# Patient Record
Sex: Female | Born: 1985 | Race: White | Hispanic: No | Marital: Single | Smoking: Never smoker
Health system: Southern US, Community
[De-identification: ages and names within clinical notes are randomized; demographics above are authoritative.]

## PROBLEM LIST (undated history)

## (undated) DIAGNOSIS — F419 Anxiety disorder, unspecified: Secondary | ICD-10-CM

## (undated) DIAGNOSIS — F489 Nonpsychotic mental disorder, unspecified: Secondary | ICD-10-CM

## (undated) DIAGNOSIS — E039 Hypothyroidism, unspecified: Secondary | ICD-10-CM

## (undated) DIAGNOSIS — Z7289 Other problems related to lifestyle: Secondary | ICD-10-CM

## (undated) HISTORY — PX: FRACTURE SURGERY: SHX138

## (undated) HISTORY — DX: Other problems related to lifestyle: Z72.89

## (undated) HISTORY — DX: Hypothyroidism, unspecified: E03.9

## (undated) HISTORY — DX: Anxiety disorder, unspecified: F41.9

## (undated) HISTORY — DX: Nonpsychotic mental disorder, unspecified: F48.9

---
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
## 2007-03-08 ENCOUNTER — Ambulatory Visit: Payer: Self-pay | Admitting: Internal Medicine

## 2007-03-08 DIAGNOSIS — F489 Nonpsychotic mental disorder, unspecified: Secondary | ICD-10-CM | POA: Insufficient documentation

## 2007-03-08 DIAGNOSIS — L708 Other acne: Secondary | ICD-10-CM | POA: Insufficient documentation

## 2007-03-08 HISTORY — DX: Nonpsychotic mental disorder, unspecified: F48.9

## 2007-04-19 ENCOUNTER — Telehealth: Payer: Self-pay | Admitting: *Deleted

## 2007-09-07 ENCOUNTER — Ambulatory Visit: Payer: Self-pay | Admitting: Internal Medicine

## 2007-09-07 DIAGNOSIS — G4720 Circadian rhythm sleep disorder, unspecified type: Secondary | ICD-10-CM | POA: Insufficient documentation

## 2008-07-02 ENCOUNTER — Ambulatory Visit: Payer: Self-pay | Admitting: Internal Medicine

## 2008-07-02 ENCOUNTER — Telehealth: Payer: Self-pay | Admitting: *Deleted

## 2008-07-02 DIAGNOSIS — F410 Panic disorder [episodic paroxysmal anxiety] without agoraphobia: Secondary | ICD-10-CM

## 2008-07-02 DIAGNOSIS — T50995A Adverse effect of other drugs, medicaments and biological substances, initial encounter: Secondary | ICD-10-CM | POA: Insufficient documentation

## 2009-04-23 ENCOUNTER — Emergency Department (HOSPITAL_COMMUNITY): Admission: EM | Admit: 2009-04-23 | Discharge: 2009-04-24 | Payer: Self-pay | Admitting: Emergency Medicine

## 2009-04-23 ENCOUNTER — Telehealth: Payer: Self-pay | Admitting: Family Medicine

## 2009-04-23 ENCOUNTER — Ambulatory Visit: Payer: Self-pay | Admitting: Internal Medicine

## 2009-04-23 DIAGNOSIS — R110 Nausea: Secondary | ICD-10-CM | POA: Insufficient documentation

## 2009-04-23 LAB — CONVERTED CEMR LAB
Nitrite: NEGATIVE
Specific Gravity, Urine: >=1.03
Urobilinogen, UA: 0.2

## 2009-04-24 ENCOUNTER — Telehealth: Payer: Self-pay | Admitting: *Deleted

## 2009-04-24 ENCOUNTER — Telehealth (INDEPENDENT_AMBULATORY_CARE_PROVIDER_SITE_OTHER): Payer: Self-pay | Admitting: *Deleted

## 2009-04-24 LAB — CONVERTED CEMR LAB
BUN: 8 mg/dL (ref 6–23)
Basophils Absolute: 0 10*3/uL (ref 0.0–0.1)
Bilirubin, Direct: 0.1 mg/dL (ref 0.0–0.3)
CO2: 27 meq/L (ref 19–32)
Chloride: 106 meq/L (ref 96–112)
Creatinine, Ser: 0.7 mg/dL (ref 0.4–1.2)
Eosinophils Absolute: 0 10*3/uL (ref 0.0–0.7)
Hemoglobin: 14 g/dL (ref 12.0–15.0)
Lymphs Abs: 2 10*3/uL (ref 0.7–4.0)
Neutro Abs: 5.7 10*3/uL (ref 1.4–7.7)
Neutrophils Relative %: 69.1 % (ref 43.0–77.0)
Platelets: 302 10*3/uL (ref 150.0–400.0)
Potassium: 3.4 meq/L — ABNORMAL LOW (ref 3.5–5.1)
RBC: 4.61 M/uL (ref 3.87–5.11)
Sodium: 143 meq/L (ref 135–145)
TSH: 16.43 microintl units/mL — ABNORMAL HIGH (ref 0.35–5.50)
Total Bilirubin: 0.8 mg/dL (ref 0.3–1.2)
Total Protein: 8.6 g/dL — ABNORMAL HIGH (ref 6.0–8.3)
WBC: 8.3 10*3/uL (ref 4.5–10.5)

## 2009-04-25 LAB — CONVERTED CEMR LAB
Free T4: 0.8 ng/dL (ref 0.6–1.6)
T3, Free: 3 pg/mL (ref 2.3–4.2)

## 2009-05-09 ENCOUNTER — Ambulatory Visit: Payer: Self-pay | Admitting: Endocrinology

## 2009-05-09 DIAGNOSIS — E039 Hypothyroidism, unspecified: Secondary | ICD-10-CM

## 2009-06-24 ENCOUNTER — Telehealth: Payer: Self-pay | Admitting: *Deleted

## 2009-07-16 ENCOUNTER — Ambulatory Visit: Payer: Self-pay | Admitting: Internal Medicine

## 2009-07-16 DIAGNOSIS — Z91199 Patient's noncompliance with other medical treatment and regimen due to unspecified reason: Secondary | ICD-10-CM | POA: Insufficient documentation

## 2009-07-16 DIAGNOSIS — Z9119 Patient's noncompliance with other medical treatment and regimen: Secondary | ICD-10-CM

## 2009-07-29 ENCOUNTER — Telehealth: Payer: Self-pay | Admitting: Endocrinology

## 2009-08-16 ENCOUNTER — Ambulatory Visit: Payer: Self-pay | Admitting: Family Medicine

## 2009-08-16 DIAGNOSIS — K219 Gastro-esophageal reflux disease without esophagitis: Secondary | ICD-10-CM | POA: Insufficient documentation

## 2009-08-16 DIAGNOSIS — K589 Irritable bowel syndrome without diarrhea: Secondary | ICD-10-CM

## 2009-08-26 ENCOUNTER — Ambulatory Visit: Payer: Self-pay | Admitting: Internal Medicine

## 2009-10-14 ENCOUNTER — Ambulatory Visit: Payer: Self-pay | Admitting: Internal Medicine

## 2009-10-28 ENCOUNTER — Ambulatory Visit: Payer: Self-pay | Admitting: Internal Medicine

## 2009-10-28 ENCOUNTER — Telehealth: Payer: Self-pay | Admitting: Internal Medicine

## 2009-10-28 DIAGNOSIS — J042 Acute laryngotracheitis: Secondary | ICD-10-CM | POA: Insufficient documentation

## 2010-02-04 NOTE — Progress Notes (Signed)
Summary: abnormal labs at ER  Phone Note Call from Patient   Caller: Mom  Eunice Blase) Call For: Taylor Headings MD Summary of Call: Pt's mom called to let Dr. Fabian Sharp know she took [name] to the ER last night, and was told that Pt's thyroid levels were abnormal, and needed to be addressed.  Guadalupe is having multiple anxiety attacks.  [phone] Initial call taken by: Lynann Beaver CMA,  April 24, 2009 10:37 AM  Follow-up for Phone Call        See lab results. Pt's mom aware of lab results.  Follow-up by: Romualdo Bolk, CMA (AAMA),  April 24, 2009 10:44 AM

## 2010-02-04 NOTE — Progress Notes (Signed)
  Phone Note From Other Clinic   Caller: [facility] Call For: panosh Summary of Call: ER called to get lab results from office visit today---pt in ER ---  they will do abd xray--secondary to pt hx constipation and elevated TSH---- phenergan was not working so they will give her zofran.   As long as xrays are normal pt will be sent home Initial call taken by: Loreen Freud DO,  April 23, 2009 9:16 PM

## 2010-02-04 NOTE — Assessment & Plan Note (Signed)
Summary: nauseated/ccm   Vital Signs:  Patient profile:   25 year old female Menstrual status:  irregular LMP:     04/14/2009 Height:      64.5 inches Weight:      91 pounds BMI:     15.43 Temp:     98.5 degrees F oral Pulse rate:   72 / minute BP sitting:   120 / 80  (right arm) Cuff size:   Peds  Vitals Entered By: Romualdo Bolk, CMA (AAMA) (April 23, 2009 10:18 AM) CC: Nausea that started on 4/18. No vomiting. Some abd. cramping with burping. No fever but has been shivering due to panic attacks. Sore throat that is dry from not eating or drinking. On Sunday 4/17 had constipation. Pt hasn't eat or drink for 24 hours. She has tried but it brings on a panic attack due to nausea. Nausea comes and goes.  LMP (date): 04/14/2009     Enter LMP: 04/14/2009   History of Present Illness: Taylor Swanson comesin for an acute SDA appt for above for nausea 24 hours. refulsal toddrink or eat cause of rear o nausea. ? abd epigastic discomfort but no vomiting fever or diarrhea. Mom  says she gets very anxious with   nausea and hard to tell what is going on.   Taylor Swanson gets consitpation and worries about this.  Last ov was last June and since then has been doing ok on 20 mg of paxil without missed doses.  Minimal to no panic attacks.  Periods ok no risk of pregnancy by hx.      no counselor  because wont talk to counselor . just on meds.    Preventive Screening-Counseling & Management  Alcohol-Tobacco     Alcohol drinks/day: 0     Smoking Status: never     Passive Smoke Exposure: no  Caffeine-Diet-Exercise     Caffeine use/day: 1 cup a day.      Does Patient Exercise: no     Type of exercise: treadmill, swimming, exercise bike     Times/week: 3  Current Medications (verified): 1)  Paxil 20 Mg  Tabs (Paroxetine Hcl) .Marland Kitchen.. 1 By Mouth Once Daily or As Directed  Allergies (verified): No Known Drug Allergies  Past History:  Past medical, surgical, family and social histories (including  risk factors) reviewed for relevance to current acute and chronic problems.  Past Medical History: Reviewed history from 09/07/2007 and no changes required. Anxiety see past chart  Family History: Reviewed history from 03/08/2007 and no changes required. no change see chart  Social History: Reviewed history from 09/07/2007 and no changes required. Single Never Smoked Alcohol use-no Drug use-no Regular exercise-yes ? tad  at home finishing hs on line .  [employer]     Review of Systems       The patient complains of anorexia.  The patient denies fever, weight loss, weight gain, vision loss, decreased hearing, hoarseness, chest pain, syncope, peripheral edema, prolonged cough, headaches, hemoptysis, melena, hematochezia, severe indigestion/heartburn, hematuria, muscle weakness, suspicious skin lesions, transient blindness, difficulty walking, depression, unusual weight change, abnormal bleeding, enlarged lymph nodes, and angioedema.    Physical Exam  General:  alert, well-developed, and well-hydrated.  mils distress  anxious and holding stomach.  verbal and anxious Head:  normocephalic and atraumatic.   Eyes:  vision grossly intact and pupils equal.   Ears:  R ear normal and L ear normal.   Mouth:  pharynx pink and moist.   dry lips Neck:  No deformities, masses, or tenderness noted. Lungs:  Normal respiratory effort, chest expands symmetrically. Lungs are clear to auscultation, no crackles or wheezes.no dullness.   Heart:  Normal rate and regular rhythm. S1 and S2 normal without gallop, murmur, click, rub or other extra sounds.no lifts.   Abdomen:  normal bowel sounds, no distention, no hepatomegaly, and no splenomegaly.  voluntary guarding   no focal tenderness but epigatric area most tender . No rebound. n o distension Pulses:  pulses intact without delay   Extremities:  no clubbing cyanosis or edema  Neurologic:  alert & oriented X3 and gait normal.   Skin:  turgor normal and  color normal.  faint red blotches right back and anterior chest.   Cervical Nodes:  No lymphadenopathy noted Psych:  Oriented X3, good eye contact, not depressed appearing, moderately anxious, and severely anxious.     Impression & Recommendations:  Problem # 1:  NAUSEA (ICD-787.02) 24  hours with hx of same but not this bad . no obv cause   r/o  early appendicitis doubt.    poss gastritis   get lab today  and rx symptomatically and then follow up as appropiate Her updated medication list for this problem includes:    Promethazine Hcl 25 Mg Tabs (Promethazine hcl) .Marland Kitchen... 1 by mouth q4-6 hour as needed nausea  Orders: UA Dipstick w/o Micro (automated)  (81003) Venipuncture (95621) Promethazine up to 50mg  (J2550) Admin of Therapeutic Inj  intramuscular or subcutaneous (30865) TLB-CBC Platelet - w/Differential (85025-CBCD) TLB-BMP (Basic Metabolic Panel-BMET) (80048-METABOL) TLB-Hepatic/Liver Function Pnl (80076-HEPATIC) TLB-TSH (Thyroid Stimulating Hormone) (84443-TSH)  Problem # 2:  ANXIETY (ICD-300.00) not seeing counser because " wont talk to counselor   but apparently stable and   no misses dose of meds.  Her updated medication list for this problem includes:    Paxil 20 Mg Tabs (Paroxetine hcl) .Marland Kitchen... 1 by mouth once daily or as directed  Orders: TLB-TSH (Thyroid Stimulating Hormone) (84443-TSH)  Complete Medication List: 1)  Paxil 20 Mg Tabs (Paroxetine hcl) .Marland Kitchen.. 1 by mouth once daily or as directed 2)  Promethazine Hcl 25 Mg Tabs (Promethazine hcl) .Marland Kitchen.. 1 by mouth q4-6 hour as needed nausea  Patient Instructions: 1)  take fluids   frequently to help with hydration . 2)  You will be informed of lab results when available.  3)  then plan  Prescriptions: PROMETHAZINE HCL 25 MG TABS (PROMETHAZINE HCL) 1 by mouth q4-6 hour as needed nausea  #15 x 0   Entered and Authorized by:   Madelin Headings MD   Signed by:   Madelin Headings MD on 04/23/2009   Method used:   Electronically to         [facility]* (retail)       [address]       [place], [place]  [id number]       Ph: [phone]       Fax: [phone]   RxID:   [id number]    Medication Administration  Injection # 1:    Medication: Promethazine up to 50mg     Diagnosis: NAUSEA (ICD-787.02)    Route: IM    Site: LUOQ gluteus    Exp Date: 05/06/2010    Lot #: [id number]    Mfr: novaplus    Comments: Gave 25mg  IM    Patient tolerated injection without complications    Given by: Romualdo Bolk, CMA Duncan Dull) (April 23, 2009 11:55 AM)  Orders Added: 1)  UA Dipstick w/o Micro (automated)  [81003] 2)  Venipuncture [16109] 3)  Promethazine up to 50mg  [J2550] 4)  Admin of Therapeutic Inj  intramuscular or subcutaneous [96372] 5)  TLB-CBC Platelet - w/Differential [85025-CBCD] 6)  TLB-BMP (Basic Metabolic Panel-BMET) [80048-METABOL] 7)  TLB-Hepatic/Liver Function Pnl [80076-HEPATIC] 8)  TLB-TSH (Thyroid Stimulating Hormone) [84443-TSH] 9)  Est. Patient Level IV [60454]  Laboratory Results   Urine Tests  Date/Time Recieved: April 23, 2009 2:14 PM  Date/Time Reported: April 23, 2009 2:13 PM   Routine Urinalysis   Color: yellow Appearance: Clear Glucose: negative   (Normal Range: Negative) Bilirubin: negative   (Normal Range: Negative) Ketone: trace (5)   (Normal Range: Negative) Spec. Gravity: >=1.030   (Normal Range: 1.003-1.035) Blood: trace-lysed   (Normal Range: Negative) pH: 5.5   (Normal Range: 5.0-8.0) Protein: 1+   (Normal Range: Negative) Urobilinogen: 0.2   (Normal Range: 0-1) Nitrite: negative   (Normal Range: Negative) Leukocyte Esterace: trace   (Normal Range: Negative)    Comments: Wynona Canes, CMA  April 23, 2009 2:14 PM

## 2010-02-04 NOTE — Assessment & Plan Note (Signed)
Summary: 3 month follow up/cjr   Vital Signs:  Patient profile:   25 year old female Menstrual status:  irregular LMP:     09/22/2009 Weight:      93 pounds Pulse rate:   78 / minute BP sitting:   110 / 70  (right arm) Cuff size:   regular  Vitals Entered By: Romualdo Bolk, CMA (AAMA) (October 14, 2009 3:08 PM) CC: follow-up visit LMP (date): 09/22/2009     Enter LMP: 09/22/2009   History of Present Illness: Taylor Swanson comes in today  with mom for follow up of medications. since last visit she has gained 7 pounds and doing better. Lessa nxiety on the current dosing.   No new nausea .   doing better.  Same dose of thyroid replacement.    Preventive Screening-Counseling & Management  Alcohol-Tobacco     Alcohol drinks/day: 0     Smoking Status: never     Passive Smoke Exposure: no  Caffeine-Diet-Exercise     Caffeine use/day: 1 cup a day.      Does Patient Exercise: no     Type of exercise: treadmill, swimming, exercise bike     Times/week: 3  Current Medications (verified): 1)  Paxil 20 Mg  Tabs (Paroxetine Hcl) .... 1.5  By Mouth Once Daily or As Directed 2)  Levothyroxine Sodium 75 Mcg Tabs (Levothyroxine Sodium) .Marland Kitchen.. 1 Once Daily 3)  Zofran Odt 4 Mg Tbdp (Ondansetron) .Marland Kitchen.. 1 Q 6 Hours As Needed Nausea 4)  Omeprazole 40 Mg Cpdr (Omeprazole) .... Once Daily 5)  Miralax  Powd (Polyethylene Glycol 3350) .... Once Daily  Allergies (verified): No Known Drug Allergies  Past History:  Past medical, surgical, family and social histories (including risk factors) reviewed, and no changes noted (except as noted below).  Past Medical History: Reviewed history from 07/16/2009 and no changes required. Anxiety see past chart Hypothyroid  panic  hx of self mutilation  Past History:  Care Management: Ophthalmology: Hanley Seamen  Endocrinology: Everardo All  Family History: Reviewed history from 05/09/2009 and no changes required. no change see chart  Social  History: Reviewed history from 09/07/2007 and no changes required. Single Never Smoked Alcohol use-no Drug use-no Regular exercise-yes ? tad  at home finishing hs on line .  [employer]     [employer].  to get HS  diploma .   Review of Systems       neg cv pulmonary .    Physical Exam  General:  Well-developed,well-nourished,in no acute distress; alert,appropriate and cooperative throughout examination Head:  normocephalic and atraumatic.   Neck:  non tender  paplpable thyroid  Lungs:  Normal respiratory effort, chest expands symmetrically. Lungs are clear to auscultation, no crackles or wheezes.no dullness.   Heart:  Normal rate and regular rhythm. S1 and S2 normal without gallop, murmur, click, rub or other extra sounds.no lifts.   Abdomen:  Bowel sounds positive,abdomen soft and non-tender without masses, organomegaly or   noted. Msk:  no joint warmth and no redness over joints.   Pulses:  pulses intact without delay   Extremities:  no clubbing cyanosis or edema  Neurologic:  alert & oriented X3 and gait normal.   Skin:  slight acne on face  Cervical Nodes:  No lymphadenopathy noted Psych:  mildly anxious      Impression & Recommendations:  Problem # 1:  ANXIETY (ICD-300.00) Assessment Improved stay on same meds no recent attacks  Her updated medication list for this problem includes:  Paxil 20 Mg Tabs (Paroxetine hcl) .Marland Kitchen... 1.5  by mouth once daily or as directed  Problem # 2:  HYPOTHYROIDISM (ICD-244.9)  Her updated medication list for this problem includes:    Levothyroxine Sodium 75 Mcg Tabs (Levothyroxine sodium) .Marland Kitchen... 1 once daily  Labs Reviewed: TSH: 1.97 (08/26/2009)    Chol: 151 (04/09/2006)   HDL: 39.9 (04/09/2006)     Problem # 3:  GERD (ICD-530.81) ? omeprazole may be helping also  Her updated medication list for this problem includes:    Omeprazole 40 Mg Cpdr (Omeprazole) ..... Once daily  Complete Medication List: 1)  Paxil 20 Mg Tabs (Paroxetine  hcl) .... 1.5  by mouth once daily or as directed 2)  Levothyroxine Sodium 75 Mcg Tabs (Levothyroxine sodium) .Marland Kitchen.. 1 once daily 3)  Zofran Odt 4 Mg Tbdp (Ondansetron) .Marland Kitchen.. 1 q 6 hours as needed nausea 4)  Omeprazole 40 Mg Cpdr (Omeprazole) .... Once daily 5)  Miralax Powd (Polyethylene glycol 3350) .... Once daily  Patient Instructions: 1)  continue the paxil  as directed . 2)  Ok to stay on omeprezole  as needed for your stomach.  3)  Contact Dr Everardo All to decide on follow up of your thyroid. 4)  Wellness visit in 6 months or as needed.  5)  Call in meantime for refills   Contraindications/Deferment of Procedures/Staging:    Test/Procedure: FLU VAX    Reason for deferment: patient declined

## 2010-02-04 NOTE — Progress Notes (Signed)
Summary: Call-A-Nurse Report    Call-A-Nurse Triage Call Report Triage Record Num: [id number] Operator: Karenann Cai Patient Name: Taylor Swanson Call Date & Time: 04/23/2009 6:50:22PM Patient Phone: PCP: Neta Mends. Panosh Patient Gender: Female PCP Fax : [phone] Patient DOB: 1985-09-27 Practice Name: [facility] Reason for Call: Mom/[name] is calling because patient had a phenergan injection this am at 10:30. Patient has taken Phenergan 25 mg, 1 po at 16:30. Symptom: nausea and stomach cramping: no vomiting. Onset of nausea: 04/22/2009. Afebrile. Patient last drank fluids at 16:30. Patient last urinated at 10:30. LMP: last week. Fluid intake: 2 ounces since this am. RN advised Mom to take patient to [facility] ER for evaluation and follow-up with PCP in the am. Protocol(s) Used: Nausea / Vomiting Recommended Outcome per Protocol: See ED Immediately Reason for Outcome: Signs of dehydration Care Advice:  ~ Another adult should drive. 56/38/7564 3:32:95JO Page 1 of 1 [id number]

## 2010-02-04 NOTE — Assessment & Plan Note (Signed)
Summary: nausea/panic attacks/dm   Vital Signs:  Patient profile:   25 year old female Menstrual status:  irregular Weight:      86 pounds BMI:     14.59 Temp:     98.0 degrees F oral BP sitting:   98 / 70  (left arm) Cuff size:   regular  Vitals Entered By: Raechel Ache, RN (August 16, 2009 3:26 PM) CC: C/o nausea, off & on.   History of Present Illness: Here with her mother for some recent flares of nausea, which then trigger panic attacks. She has struggled with anxiety and these panic attacks for several years, although she think increasing her Paxil from 20 mg a day to 30 mg a day has been helpful. She had another spell this morning at home of nausea which then triggered what was close to a panic attack. She feels an intense fear, her heart pounds, asn she gets SOB. She was able to stop this from going full blown by deep breathing and standing in the shower. She is still slightly nauseated as we speak but she denies any anxiety at all no. She is adamant that all her anxiety attacks are preceded by nausea, and she is certain she does not get nauseated as a result of the anxiety. She sleeps well at night. Her appetite is good. She very seldom vomits but gets nauseated, and this can last for hours. She was given some Zofran at an ER visit once, and they helped. She would like to get more. She admits to frequent heartburn, belching, and tasting stomach contents in the back of her mouth. She denies any abdominal pain. She tends to be constipated a lot and asks my advice about this.   Allergies (verified): No Known Drug Allergies  Past History:  Past Medical History: Reviewed history from 07/16/2009 and no changes required. Anxiety see past chart Hypothyroid  panic  hx of self mutilation  Review of Systems  The patient denies anorexia, fever, weight loss, weight gain, vision loss, decreased hearing, hoarseness, chest pain, syncope, dyspnea on exertion, peripheral edema, prolonged  cough, headaches, hemoptysis, abdominal pain, melena, hematochezia, severe indigestion/heartburn, hematuria, incontinence, genital sores, muscle weakness, suspicious skin lesions, transient blindness, difficulty walking, depression, unusual weight change, abnormal bleeding, enlarged lymph nodes, angioedema, breast masses, and testicular masses.    Physical Exam  General:  Well-developed,well-nourished,in no acute distress; alert,appropriate and cooperative throughout examination Lungs:  Normal respiratory effort, chest expands symmetrically. Lungs are clear to auscultation, no crackles or wheezes. Heart:  Normal rate and regular rhythm. S1 and S2 normal without gallop, murmur, click, rub or other extra sounds. Abdomen:  Bowel sounds positive,abdomen soft and non-tender without masses, organomegaly or hernias noted. Psych:  Cognition and judgment appear intact. Alert and cooperative with normal attention span and concentration. No apparent delusions, illusions, hallucinations   Impression & Recommendations:  Problem # 1:  GERD (ICD-530.81)  Her updated medication list for this problem includes:    Omeprazole 40 Mg Cpdr (Omeprazole) ..... Once daily  Problem # 2:  IRRITABLE BOWEL SYNDROME (ICD-564.1)  Problem # 3:  NAUSEA (ICD-787.02)  Her updated medication list for this problem includes:    Zofran Odt 4 Mg Tbdp (Ondansetron) .Marland Kitchen... 1 q 6 hours as needed nausea  Problem # 4:  PANIC ATTACK (ICD-300.01)  Her updated medication list for this problem includes:    Paxil 20 Mg Tabs (Paroxetine hcl) .Marland Kitchen... 1.5  by mouth once daily or as directed  Problem #  5:  ANXIETY (ICD-300.00)  Her updated medication list for this problem includes:    Paxil 20 Mg Tabs (Paroxetine hcl) .Marland Kitchen... 1.5  by mouth once daily or as directed  Complete Medication List: 1)  Paxil 20 Mg Tabs (Paroxetine hcl) .... 1.5  by mouth once daily or as directed 2)  Levothyroxine Sodium 75 Mcg Tabs (Levothyroxine sodium) .Marland Kitchen..  1 once daily 3)  Zofran Odt 4 Mg Tbdp (Ondansetron) .Marland Kitchen.. 1 q 6 hours as needed nausea 4)  Omeprazole 40 Mg Cpdr (Omeprazole) .... Once daily 5)  Miralax Powd (Polyethylene glycol 3350) .... Once daily  Patient Instructions: 1)  I think if we address her GI issues her anxiety may improve. Start on Omeprazole daily for GERD symptoms, along with Miralax daily for BMs. Use Zofran as needed . Follow up with Dr. Fabian Sharp a scheduled.  Prescriptions: OMEPRAZOLE 40 MG CPDR (OMEPRAZOLE) once daily  #30 x 2   Entered and Authorized by:   Nelwyn Salisbury MD   Signed by:   Nelwyn Salisbury MD on 08/16/2009   Method used:   Electronically to        [facility] (retail)       [address]       [place], [place]  [address]       Ph: [phone]       Fax: [phone]   RxID:   [id number] ZOFRAN ODT 4 MG TBDP (ONDANSETRON) 1 q 6 hours as needed nausea  #60 x 2   Entered and Authorized by:   Nelwyn Salisbury MD   Signed by:   Nelwyn Salisbury MD on 08/16/2009   Method used:   Electronically to        [facility] (retail)       [address]       [place], [place]  [address]       Ph: [phone]       Fax: [phone]   RxID:   [id number]

## 2010-02-04 NOTE — Progress Notes (Signed)
Summary: Requesting Workin Appt Today-please triage  Phone Note Call from Patient Call back at [phone] ext [phone]   Caller: Mom- [name]  Summary of Call: Pt has severe coughing and congestion since last Thursday, getting worse not better wants to be seen today.  Please advise.   Appointment given.  Rudy Jew, RN  October 28, 2009 10:00 AM  Initial call taken by: Trixie Dredge,  October 28, 2009 9:35 AM

## 2010-02-04 NOTE — Assessment & Plan Note (Signed)
Summary: SEVERE COUGH & CONGESTION/MOTHER WANTS HER SEEN/PS   Vital Signs:  Patient profile:   25 year old female Menstrual status:  irregular Weight:      95 pounds Temp:     98.7 degrees F oral Pulse rate:   72 / minute BP sitting:   110 / 70  (right arm) Cuff size:   regular  Vitals Entered By: Romualdo Bolk, CMA (AAMA) (October 28, 2009 11:12 AM) CC: Sore throat, coughing, congestion. No fever, no abd. pain. This started on 10/17 with a sore throat that went away for a day or two.    History of Present Illness: Taylor Swanson comes in today   with mom for acute visit .  sore throat last week that got better and then returned with hoarseness  and now cough for 2 days  in spasms.  No cp sob but  sore in anterior neck upper chest. Dry cough   tried nyquil no help  ttaking warm liquids without problem. .   Preventive Screening-Counseling & Management  Alcohol-Tobacco     Alcohol drinks/day: 0     Smoking Status: never     Passive Smoke Exposure: no  Caffeine-Diet-Exercise     Caffeine use/day: 1 cup a day.      Does Patient Exercise: no     Type of exercise: treadmill, swimming, exercise bike     Times/week: 3  Current Medications (verified): 1)  Paxil 20 Mg  Tabs (Paroxetine Hcl) .... 1.5  By Mouth Once Daily or As Directed 2)  Levothyroxine Sodium 75 Mcg Tabs (Levothyroxine Sodium) .Marland Kitchen.. 1 Once Daily 3)  Zofran Odt 4 Mg Tbdp (Ondansetron) .Marland Kitchen.. 1 Q 6 Hours As Needed Nausea 4)  Omeprazole 40 Mg Cpdr (Omeprazole) .... Once Daily 5)  Miralax  Powd (Polyethylene Glycol 3350) .... Once Daily  Allergies (verified): No Known Drug Allergies  Past History:  Past medical, surgical, family and social histories (including risk factors) reviewed, and no changes noted (except as noted below).  Past Medical History: Reviewed history from 07/16/2009 and no changes required. Anxiety see past chart Hypothyroid  panic  hx of self mutilation  Past History:  Care  Management: Ophthalmology: Hanley Seamen  Endocrinology: Everardo All  Family History: Reviewed history from 05/09/2009 and no changes required. no change see chart  Social History: Reviewed history from 10/14/2009 and no changes required. Single Never Smoked Alcohol use-no Drug use-no Regular exercise-yes ? tad  at home finishing hs on line .  [employer].  to get HS  diploma .   Review of Systems       The patient complains of hoarseness.  The patient denies fever, vision loss, decreased hearing, syncope, dyspnea on exertion, headaches, and hemoptysis.         no fever     Physical Exam  General:  Well-developed,well-nourished,in no acute distress; alert,appropriate and cooperative throughout examination  moderatley  hoarse  Eyes:  clear  Ears:  R ear normal.   Nose:  midlcongestion  minimal facial tenderness Mouth:  pharynx pink and moist.   Neck:  No deformities, masses, or tenderness noted. Lungs:  Normal respiratory effort, chest expands symmetrically. Lungs are clear to auscultation, no crackles or wheezes.no dullness.    no stridor  Heart:  Normal rate and regular rhythm. S1 and S2 normal without gallop, murmur, click, rub or other extra sounds. Pulses:  pulses intact without delay  non focal  Extremities:  no clubbing cyanosis or edema  Skin:  no suspicious lesions and no ecchymoses.   Cervical Nodes:  No lymphadenopathy noted Psych:  Oriented X3 and good eye contact.     Impression & Recommendations:  Problem # 1:  LARYNGOTRACHEITIS, ACUTE (ICD-464.20) appears viral by exam an c ontext .    Expectant management and symptoms rx  .     alarm features discussed andca;; with this.  avoid decongestants because of the anxiety issue.  Complete Medication List: 1)  Paxil 20 Mg Tabs (Paroxetine hcl) .... 1.5  by mouth once daily or as directed 2)  Levothyroxine Sodium 75 Mcg Tabs (Levothyroxine sodium) .Marland Kitchen.. 1 once daily 3)  Zofran Odt 4 Mg Tbdp (Ondansetron) .Marland Kitchen.. 1  q 6 hours as needed nausea 4)  Omeprazole 40 Mg Cpdr (Omeprazole) .... Once daily 5)  Miralax Powd (Polyethylene glycol 3350) .... Once daily 6)  Hydromet 5-1.5 Mg/61ml Syrp (Hydrocodone-homatropine) .Marland Kitchen.. 1-2 tsp by mouth q4-6 hours as needed cough  Patient Instructions: 1)  I think this is a viral respiratory infection that is affecting your vocal chords and large airwyas.  this should improve in the next 7-10 days .  2)  call  if fever shortness or breath or if not improving as above. 3)  Try Mucinex plain to loosen the cough and make it  more effective  and less  severe. 4)  Also can use the codine cough med if needed esp at night for comfort .  Prescriptions: HYDROMET 5-1.5 MG/5ML SYRP (HYDROCODONE-HOMATROPINE) 1-2 tsp by mouth q4-6 hours as needed cough  #6 0z x 0   Entered and Authorized by:   Madelin Headings MD   Signed by:   Madelin Headings MD on 10/28/2009   Method used:   Print then Give to Patient   RxID:   [id number]    Orders Added: 1)  Est. Patient Level III [96295]

## 2010-02-04 NOTE — Assessment & Plan Note (Signed)
Summary: Follow up on meds/ssc mom rsc/njr   Vital Signs:  Patient profile:   25 year old female Menstrual status:  irregular LMP:     07/06/2009 Weight:      89 pounds Pulse rate:   72 / minute BP sitting:   100 / 60  (right arm) Cuff size:   regular  Vitals Entered By: Romualdo Bolk, CMA (AAMA) (July 16, 2009 2:58 PM) CC: Follow-up visit on meds LMP (date): 07/06/2009     Enter LMP: 07/06/2009   History of Present Illness: Taylor Swanson  come in today for ,fu of ,meds  Since last visit she has seen Dr Everardo All  for her thyroid dysfunction and was put on 75  of levthyroxine. This helped her feel better  but hasnt taken it for at leasat 20 days  forgets  and  out of the last 30  ran out .  Plans on rececing wiht Dr E.   IN regard to the paxil      no more panix c attacs on new meds.     Doing better  feels should stay on same dose.   Nausea attack also better .  Acne better  using otcs   Preventive Screening-Counseling & Management  Alcohol-Tobacco     Alcohol drinks/day: 0     Smoking Status: never     Passive Smoke Exposure: no  Caffeine-Diet-Exercise     Caffeine use/day: 1 cup a day.      Does Patient Exercise: no     Type of exercise: treadmill, swimming, exercise bike     Times/week: 3  Current Medications (verified): 1)  Paxil 20 Mg  Tabs (Paroxetine Hcl) .... 1.5  By Mouth Once Daily or As Directed 2)  Levothyroxine Sodium 75 Mcg Tabs (Levothyroxine Sodium) .Marland Kitchen.. 1 Once Daily  Allergies (verified): No Known Drug Allergies  Past History:  Past medical, surgical, family and social histories (including risk factors) reviewed, and no changes noted (except as noted below).  Past Medical History: Anxiety see past chart Hypothyroid  panic  hx of self mutilation  Past History:  Care Management: Ophthalmology: Hanley Seamen  Endocrinology: Everardo All  Family History: Reviewed history from 05/09/2009 and no changes required. no change see chart  Social  History: Reviewed history from 09/07/2007 and no changes required. Single Never Smoked Alcohol use-no Drug use-no Regular exercise-yes ? tad  at home finishing hs on line .  [employer]     Review of Systems  The patient denies anorexia, fever, vision loss, decreased hearing, chest pain, headaches, abdominal pain, melena, hematochezia, severe indigestion/heartburn, difficulty walking, depression, abnormal bleeding, enlarged lymph nodes, and angioedema.    Physical Exam  General:  alert, well-developed, well-nourished, and well-hydrated.  no acute distress  more verabal and relaxed  Head:  normocephalic and atraumatic.   Eyes:  vision grossly intact, pupils equal, and pupils round.   Neck:  No deformities, masses, or tenderness noted. Lungs:  Normal respiratory effort, chest expands symmetrically. Lungs are clear to auscultation, no crackles or wheezes.no dullness.   Heart:  Normal rate and regular rhythm. S1 and S2 normal without gallop, murmur, click, rub or other extra sounds.no lifts.   Abdomen:  Bowel sounds positive,abdomen soft and non-tender without masses, organomegaly or hernias noted. Msk:  no acute findings  Pulses:  pulses intact without delay   Neurologic:  alert & oriented X3 and gait normal.   no tremor  dtrs present and symmetrical and no clonus Skin:  turgor normal  and color normal.  acne face   Cervical Nodes:  No lymphadenopathy noted Psych:  Oriented X3, good eye contact, not depressed appearing, and slightly anxious.     Impression & Recommendations:  Problem # 1:  PANIC ATTACK (ICD-300.01) Assessment Improved  Her updated medication list for this problem includes:    Paxil 20 Mg Tabs (Paroxetine hcl) .Marland Kitchen... 1.5  by mouth once daily or as directed  Discussed medication use and relaxation techniques.   Problem # 2:  HYPOTHYROIDISM (ICD-244.9)  samples given of synthroid 75    brand and to take once daily    ( cotact dose)   to follow up with Dr Lorane Gell but  rechecking tsh today wont be  helpful  as has been off med mostly   Her updated medication list for this problem includes:    Levothyroxine Sodium 75 Mcg Tabs (Levothyroxine sodium) .Marland Kitchen... 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 1.5  by mouth once daily or as directed 2)  Levothyroxine Sodium 75 Mcg Tabs (Levothyroxine sodium) .Marland Kitchen.. 1 once daily  Patient Instructions: 1)  TAKE  Thyroid medication one every day. 2)  check TSH in  6 weeks   .    3)  follow up with Dr Everardo All about the thyroid. 4)  continue on the paxil   30 mg for now . If getting more panic attacks we may increase  the dose to 40 mg per day . 5)  ROV with me in 3 -4 months or as needed . Prescriptions: PAXIL 20 MG  TABS (PAROXETINE HCL) 1.5  by mouth once daily or as directed  #45 x 4   Entered and Authorized by:   Madelin Headings MD   Signed by:   Madelin Headings MD on 07/16/2009   Method used:   Electronically to        [facility] (retail)       [address]       [place], [place]  [address]       Ph: [phone]       Fax: [phone]   RxID:   [id number]

## 2010-02-04 NOTE — Assessment & Plan Note (Signed)
Summary: abdnormal tsh/bcbs/panosh/lb   Vital Signs:  Patient profile:   25 year old female Menstrual status:  irregular Height:      64.5 inches (163.83 cm) Weight:      92 pounds (41.82 kg) O2 Sat:      97 % on Room air Temp:     98.0 degrees F (36.67 degrees C) oral Pulse rate:   93 / minute BP sitting:   116 / 72  (left arm) Cuff size:   regular  Vitals Entered By: Josph Macho RMA (May 09, 2009 3:04 PM)  O2 Flow:  Room air CC: New Endo: Abnormal TSH/ CF Is Patient Diabetic? No   CC:  New Endo: Abnormal TSH/ CF.  History of Present Illness: pt had 2 days of nausea and abdominal pain, but no associated vomiting.  sxs are resolved now, but in eval of this, she was found to have abnormal tsh.   paxil helps anxiety and depression.    Current Medications (verified): 1)  Paxil 20 Mg  Tabs (Paroxetine Hcl) .... 1.5  By Mouth Once Daily or As Directed 2)  Promethazine Hcl 25 Mg Tabs (Promethazine Hcl) .Marland Kitchen.. 1 By Mouth Q4-6 Hour As Needed Nausea  Allergies (verified): No Known Drug Allergies  Past History:  Past Medical History: Last updated: 09/07/2007 Anxiety see past chart  Family History: no change see chart  Review of Systems       denies hair loss, cramps, sob, weight gain, memory loss, numbness, blurry vision, dry skin, syncope, rhinorrhea, and easy bruising.  she has constipation.  Physical Exam  General:  normal appearance.   Head:  head: no deformity eyes: no periorbital swelling, no proptosis external nose and ears are normal mouth: no lesion seen Neck:  thyroid is 2x normal, diffusely enlarged. Lungs:  Clear to auscultation bilaterally. Normal respiratory effort.  Heart:  Regular rate and rhythm without murmurs or gallops noted. Normal S1,S2.   Abdomen:  abdomen is soft, nontender.  no hepatosplenomegaly.   not distended.  no hernia  Msk:  muscle bulk and strength are grossly normal.  no obvious joint swelling.  gait is normal and  steady  Extremities:  no edema no deformity Neurologic:  cn 2-12 grossly intact.   readily moves all 4's.   sensation is intact to touch on all 4's Skin:  normal texture and temp.  no rash.  not diaphoretic  Cervical Nodes:  No significant adenopathy.  Psych:  Alert and cooperative; normal mood and affect; normal attention span and concentration.   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 1 once daily  Other Orders: Consultation Level IV (84696)  Patient Instructions: 1)  take levothyroxine 75 micrograms/day 2)  go to lab in 1 month for tsh 244.9. 3)  return here in 4 months. Prescriptions: LEVOTHYROXINE SODIUM 75 MCG TABS (LEVOTHYROXINE SODIUM) 1 once daily  #30 x 2   Entered and Authorized by:   Minus Breeding MD   Signed by:   Minus Breeding MD on 05/09/2009   Method used:   Electronically to        [facility] (retail)  [address]       [place], [place]  [address]       Ph: [phone]       Fax: [phone]   RxID:   [id number]

## 2010-02-04 NOTE — Progress Notes (Signed)
Summary: TSH  Phone Note Outgoing Call Call back at [facility] Phone [phone]   Call placed by: Brenton Grills MA,  July 29, 2009 4:30 PM Call placed to: Patient Summary of Call: Per Flag from [facility], called pt about making appt for f/u TSH. Left message on VM to callback office  Follow-up for Phone Call        spoke with pt--pt states she will callback to schedule appt Follow-up by: Brenton Grills MA,  August 01, 2009 11:35 AM

## 2010-02-04 NOTE — Progress Notes (Signed)
Summary: Pt called req refill of Paxil 20mg   Phone Note Call from Patient Call back at Home Phone [phone]   Caller: Patient Summary of Call: Pt req Paxil 20mg . Please call in to [facility].  Initial call taken by: Lucy Antigua,  June 24, 2009 4:42 PM  Follow-up for Phone Call        Pt aware that she needs a follow up appt. Appt made. Pt is also aware of the $50.00 No show policy. Rx sent to her pharmacy. Follow-up by: Romualdo Bolk, CMA Duncan Dull),  June 24, 2009 4:51 PM    Prescriptions: PAXIL 20 MG  TABS (PAROXETINE HCL) 1.5  by mouth once daily or as directed  #45 x 0   Entered by:   Romualdo Bolk, CMA (AAMA)   Authorized by:   Madelin Headings MD   Signed by:   Romualdo Bolk, CMA (AAMA) on 06/24/2009   Method used:   Electronically to        [facility] (retail)       [address]       [place], [place]  [id number]       Ph: [phone]       Fax: [phone]   RxID:   [id number]

## 2010-03-25 LAB — DIFFERENTIAL
Basophils Absolute: 0 10*3/uL (ref 0.0–0.1)
Basophils Relative: 0 % (ref 0–1)
Eosinophils Absolute: 0 10*3/uL (ref 0.0–0.7)
Eosinophils Relative: 0 % (ref 0–5)
Lymphocytes Relative: 33 % (ref 12–46)
Lymphs Abs: 2.5 10*3/uL (ref 0.7–4.0)
Monocytes Absolute: 0.5 10*3/uL (ref 0.1–1.0)
Monocytes Relative: 6 % (ref 3–12)
Neutro Abs: 4.5 10*3/uL (ref 1.7–7.7)
Neutrophils Relative %: 60 % (ref 43–77)

## 2010-03-25 LAB — BASIC METABOLIC PANEL
BUN: 11 mg/dL (ref 6–23)
CO2: 24 mEq/L (ref 19–32)
Calcium: 9.4 mg/dL (ref 8.4–10.5)
Chloride: 105 mEq/L (ref 96–112)
Creatinine, Ser: 0.68 mg/dL (ref 0.4–1.2)
GFR calc Af Amer: >60 mL/min (ref >60)
GFR calc non Af Amer: >60 mL/min (ref >60)
Glucose, Bld: 78 mg/dL (ref 70–99)
Potassium: 3.4 mEq/L — ABNORMAL LOW (ref 3.5–5.1)
Sodium: 138 mEq/L (ref 135–145)

## 2010-03-25 LAB — URINE MICROSCOPIC-ADD ON

## 2010-03-25 LAB — URINALYSIS, ROUTINE W REFLEX MICROSCOPIC
Glucose, UA: NEGATIVE mg/dL
Specific Gravity, Urine: 1.027 (ref 1.005–1.030)
pH: 8 (ref 5.0–8.0)

## 2010-03-25 LAB — CBC
HCT: 41.3 % (ref 36.0–46.0)
Hemoglobin: 14.4 g/dL (ref 12.0–15.0)
MCHC: 34.9 g/dL (ref 30.0–36.0)
MCV: 88 fL (ref 78.0–100.0)
Platelets: 262 10*3/uL (ref 150–400)
RBC: 4.69 MIL/uL (ref 3.87–5.11)
RDW: 13 % (ref 11.5–15.5)
WBC: 7.4 10*3/uL (ref 4.0–10.5)

## 2010-03-25 LAB — LIPASE, BLOOD: Lipase: 18 U/L (ref 11–59)

## 2010-03-25 LAB — GLUCOSE, CAPILLARY: Glucose-Capillary: 82 mg/dL (ref 70–99)

## 2010-07-28 ENCOUNTER — Ambulatory Visit (INDEPENDENT_AMBULATORY_CARE_PROVIDER_SITE_OTHER)
Admission: RE | Admit: 2010-07-28 | Discharge: 2010-07-28 | Disposition: A | Discharge status: Home / Self Care | Payer: BC Managed Care – PPO | Source: Ambulatory Visit | Attending: Internal Medicine | Admitting: Internal Medicine

## 2010-07-28 ENCOUNTER — Ambulatory Visit (INDEPENDENT_AMBULATORY_CARE_PROVIDER_SITE_OTHER): Payer: BC Managed Care – PPO | Admitting: Internal Medicine

## 2010-07-28 ENCOUNTER — Encounter: Payer: Self-pay | Admitting: Internal Medicine

## 2010-07-28 VITALS — BP 110/70 | HR 60 | Temp 98.6°F | Wt 90.0 lb

## 2010-07-28 DIAGNOSIS — M25519 Pain in unspecified shoulder: Secondary | ICD-10-CM

## 2010-07-28 DIAGNOSIS — F411 Generalized anxiety disorder: Secondary | ICD-10-CM

## 2010-07-28 DIAGNOSIS — S4980XA Other specified injuries of shoulder and upper arm, unspecified arm, initial encounter: Secondary | ICD-10-CM

## 2010-07-28 DIAGNOSIS — S4990XA Unspecified injury of shoulder and upper arm, unspecified arm, initial encounter: Secondary | ICD-10-CM
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
## 2010-07-28 NOTE — Progress Notes (Signed)
Pt and mom aware of results. Results and ov note faxed to Dr. Otelia Sergeant.

## 2010-07-28 NOTE — Patient Instructions (Signed)
Get x ray and will notify you of results and plan fpr follow u.

## 2010-07-31 DIAGNOSIS — S4990XA Unspecified injury of shoulder and upper arm, unspecified arm, initial encounter: Secondary | ICD-10-CM | POA: Insufficient documentation

## 2010-08-14 ENCOUNTER — Telehealth: Payer: Self-pay | Admitting: Internal Medicine

## 2010-08-14 NOTE — Telephone Encounter (Signed)
Generic paxil . Please send refill to [facility] on [address].

## 2010-08-15 ENCOUNTER — Telehealth: Payer: Self-pay | Admitting: *Deleted

## 2010-08-15 MED ORDER — PAROXETINE HCL 20 MG PO TABS: ORAL_TABLET | ORAL | Status: DC

## 2010-08-15 NOTE — Telephone Encounter (Signed)
rx sent to pharmacy

## 2010-08-15 NOTE — Telephone Encounter (Signed)
rx sent

## 2010-09-29 ENCOUNTER — Other Ambulatory Visit: Payer: Self-pay | Admitting: Internal Medicine

## 2010-12-08 ENCOUNTER — Telehealth: Payer: Self-pay | Admitting: Internal Medicine

## 2010-12-08 MED ORDER — PAROXETINE HCL 20 MG PO TABS: ORAL_TABLET | ORAL | Status: DC

## 2010-12-08 NOTE — Telephone Encounter (Signed)
rx sent to pharmacy

## 2010-12-08 NOTE — Telephone Encounter (Signed)
Pt has sch ov of Wed 12/17/10 at 1:15 but will need refil of PARoxetine (PAXIL) 20 MG tablet [facility] [place]

## 2010-12-09 ENCOUNTER — Other Ambulatory Visit: Payer: Self-pay | Admitting: Internal Medicine

## 2010-12-15 ENCOUNTER — Ambulatory Visit (INDEPENDENT_AMBULATORY_CARE_PROVIDER_SITE_OTHER): Payer: BC Managed Care – PPO | Admitting: Internal Medicine

## 2010-12-15 ENCOUNTER — Encounter: Payer: Self-pay | Admitting: Internal Medicine

## 2010-12-15 VITALS — BP 120/60 | HR 72 | Wt 89.0 lb

## 2010-12-15 DIAGNOSIS — R627 Adult failure to thrive: Secondary | ICD-10-CM

## 2010-12-15 DIAGNOSIS — F411 Generalized anxiety disorder: Secondary | ICD-10-CM

## 2010-12-15 DIAGNOSIS — K589 Irritable bowel syndrome without diarrhea: Secondary | ICD-10-CM

## 2010-12-15 DIAGNOSIS — E039 Hypothyroidism, unspecified: Secondary | ICD-10-CM

## 2010-12-15 DIAGNOSIS — N926 Irregular menstruation, unspecified: Secondary | ICD-10-CM | POA: Insufficient documentation

## 2010-12-15 DIAGNOSIS — R636 Underweight: Secondary | ICD-10-CM | POA: Insufficient documentation

## 2010-12-15 LAB — BASIC METABOLIC PANEL
BUN: 14 mg/dL (ref 6–23)
Calcium: 9.7 mg/dL (ref 8.4–10.5)
Chloride: 102 mEq/L (ref 96–112)
Creatinine, Ser: 0.7 mg/dL (ref 0.4–1.2)
GFR: 111.99 mL/min (ref >60.00)

## 2010-12-15 LAB — TSH: TSH: 1.92 u[IU]/mL (ref 0.35–5.50)

## 2010-12-15 LAB — CBC WITH DIFFERENTIAL/PLATELET
Basophils Absolute: 0 10*3/uL (ref 0.0–0.1)
Eosinophils Absolute: 0 10*3/uL (ref 0.0–0.7)
HCT: 42.6 % (ref 36.0–46.0)
Hemoglobin: 14.3 g/dL (ref 12.0–15.0)
Lymphocytes Relative: 36.1 % (ref 12.0–46.0)
Lymphs Abs: 2.2 10*3/uL (ref 0.7–4.0)
MCHC: 33.6 g/dL (ref 30.0–36.0)
MCV: 88.7 fl (ref 78.0–100.0)
Monocytes Relative: 6.9 % (ref 3.0–12.0)
Neutro Abs: 3.4 10*3/uL (ref 1.4–7.7)

## 2010-12-15 LAB — T4, FREE: Free T4: 0.76 ng/dL (ref 0.60–1.60)

## 2010-12-15 NOTE — Progress Notes (Signed)
Subjective:    Patient ID: Taylor Swanson, female    DOB: 1985-01-09, 25 y.o.   MRN: [id number]  HPI Patient comes in today with her mom for a followup visit. She needs a refill on her Paxil and has been delayed in followup. She also has a form from [facility] because she takes medicine and wants [place] to fill it out.   In regard to her anxiety and panic she does very well on the Paxil every day. She hasn't had this stomach flare vomiting. She is no longer seeing the counselor because of very his reasons. But she states that she is doing well. Denies significant depression at this point. She is finishing her high school on line is interested in being an EMT mom lives in [place] now she wants to get a driver's license so she can be more independent.[facility] and pursue her goals  Thyroid disease she has not seen Dr. Everardo All in [place] and has forgotten to take her thyroid medicine she is due for lab check   Review of Systems No chest pain shortness of breath she does have acne no vomiting no weight loss no fevers.  Periods are sometimes irregular but doesn't miss them for more than a couple months in a row.  Past Medical History  Diagnosis Date  . Anxiety   . Hypothyroid   . Self mutilation     History   Social History  . Marital Status: Single    Spouse Name: N/A    Number of Children: N/A  . Years of Education: N/A   Occupational History  . Not on file.   Social History Main Topics  . Smoking status: Never Smoker   . Smokeless tobacco: Not on file  . Alcohol Use: No  . Drug Use: No  . Sexually Active:    Other Topics Concern  . Not on file   Social History Narrative   SingleRegular exercise- yes? Tad at home finishing hs on line [employer] to get hs diplomaStill working on line to get high school diplomaLives in her household with 9 cats and dogs likes animals takes care of them.Plans on trying to get her driver's license.    No past surgical history on file.  No family  history on file.  No Known Allergies  Current Outpatient Prescriptions on File Prior to Visit  Medication Sig Dispense Refill  . ondansetron (ZOFRAN) 4 MG tablet Take 4 mg by mouth every 8 (eight) hours as needed.        . polyethylene glycol (MIRALAX / GLYCOLAX) packet Take 17 g by mouth daily.        Marland Kitchen levothyroxine (SYNTHROID, LEVOTHROID) 75 MCG tablet Take 75 mcg by mouth daily.          BP 120/60  Pulse 72  Wt 89 lb (40.37 kg)  LMP 11/20/2010       Objective:   Physical Exam Physical Exam: Vital signs reviewed NWG:NFAO is a well-developed well-nourished alert cooperative  white female who appears her stated age in no acute distress.  HEENT: normocephalic  traumatic , Eyes: PERRL EOM's full, conjunctiva clear, wears glasses his eyes checked yearly Nares: paten,t no deformity discharge or tenderness., Ears: no deformity EAC's clear TMs with normal landmarks. Mouth: clear OP, no lesions, edema.  Moist mucous membranes. Dentition in adequate repair. NECK: supple without masses, thyromegaly or bruits. CHEST/PULM:  Clear to auscultation and percussion breath sounds equal no wheeze , rales or rhonchi. No chest wall deformities  or tenderness. CV: PMI is nondisplaced, S1 S2 no gallops, murmurs, rubs. Peripheral pulses are full without delay.No JVD .  ABDOMEN: Bowel sounds normal nontender  No guard or rebound, no hepato splenomegal no CVA tenderness.  No hernia. Extremtities:  No clubbing cyanosis or edema, no acute joint swelling or redness no focal atrophy NEURO:  Oriented x3, cranial nerves 3-12 appear to be intact, no obvious focal weakness,gait within normal limits no abnormal reflexes or asymmetrical SKIN: No acute rashes normal turgor, color, no bruising or petechiae. Acne on face PSYCH: Oriented, good eye contact, no obvious depression anxiety, cognition and judgment appear normal. She is more relaxed today there is no picking. No tremor. She does get a bit anxious when talking  about seeing counselors and confidentiality was explained to her.     Assessment & Plan:  Anxiety disorder panic history of mutilation Stable  agree with staying on the same medication refill as needed recheck in 6 months. DMV form reviewed we'll have to fill it out later consented in. No restrictions noted from the medical point of view in my opinion.  Thyroid disease  no followup we'll check laboratory studies today TSH T4 and CBC GI symptoms chronic infrequent stools no change other symptoms seem to be related to mood she has chronic constipation no change discussed Options.   Dietary versus medicines. Would not do anything different  at this time, unless she wanted to do a GI consult Lab and then plan followup otherwise 6 months

## 2010-12-15 NOTE — Assessment & Plan Note (Signed)
Has history of constipation and weekly bowel movements but no abdominal pain at this point

## 2010-12-15 NOTE — Assessment & Plan Note (Signed)
Doing well on Paxil in members most days only skips occasional  has history of self mutilation and panic attacks doing much better none recently.

## 2010-12-15 NOTE — Patient Instructions (Addendum)
Will notify you  of labs when available. Will contact  You when form sent.   return office visit in 6 months  Or as needed  Have pharmacy call for  refill s in the meantime.

## 2010-12-15 NOTE — Assessment & Plan Note (Signed)
Forgets to take medicine so has been off medication for a year get laboratory monitoring today.

## 2010-12-16 NOTE — Progress Notes (Signed)
Quick Note:  Left message to call back. ______ 

## 2010-12-17 ENCOUNTER — Ambulatory Visit: Payer: BC Managed Care – PPO | Admitting: Internal Medicine

## 2011-01-13 NOTE — Progress Notes (Signed)
Quick Note:  Left message to call back. ______ 

## 2011-01-16 NOTE — Progress Notes (Signed)
Quick Note:  Left message on mom's machine about the results and to call back to let us know how long she has been off her thyroid meds. ______

## 2011-04-09 ENCOUNTER — Other Ambulatory Visit: Payer: Self-pay | Admitting: Internal Medicine

## 2011-04-11 NOTE — Telephone Encounter (Signed)
Pt last seen 12/15/10.  Pls advise.

## 2011-04-13 ENCOUNTER — Telehealth: Payer: Self-pay | Admitting: Internal Medicine

## 2011-04-13 NOTE — Telephone Encounter (Signed)
Refill x 4 months

## 2011-04-13 NOTE — Telephone Encounter (Signed)
Patient called stating that she need a refill on her paxil as she only had one pill left. Please assist.

## 2011-04-13 NOTE — Telephone Encounter (Signed)
Pt last seen 12/15/10.  Pls advise.  

## 2011-04-13 NOTE — Telephone Encounter (Signed)
Please send this  i see there is another phone message and make sure she gets her refill

## 2012-01-01 ENCOUNTER — Other Ambulatory Visit: Payer: Self-pay | Admitting: Internal Medicine

## 2012-02-23 ENCOUNTER — Other Ambulatory Visit: Payer: Self-pay | Admitting: Internal Medicine

## 2012-02-25 ENCOUNTER — Ambulatory Visit: Payer: BC Managed Care – PPO | Admitting: Internal Medicine

## 2012-03-15 ENCOUNTER — Ambulatory Visit: Payer: BC Managed Care – PPO | Admitting: Internal Medicine

## 2012-03-15 ENCOUNTER — Ambulatory Visit (INDEPENDENT_AMBULATORY_CARE_PROVIDER_SITE_OTHER): Payer: BC Managed Care – PPO | Admitting: Internal Medicine

## 2012-03-15 ENCOUNTER — Encounter: Payer: Self-pay | Admitting: Internal Medicine

## 2012-03-15 VITALS — BP 124/82 | HR 76 | Temp 97.9°F | Ht 64.5 in | Wt 94.0 lb

## 2012-03-15 DIAGNOSIS — L708 Other acne: Secondary | ICD-10-CM

## 2012-03-15 DIAGNOSIS — F411 Generalized anxiety disorder: Secondary | ICD-10-CM

## 2012-03-15 MED ORDER — PAROXETINE HCL 20 MG PO TABS: ORAL_TABLET | ORAL | Status: DC

## 2012-03-15 MED ORDER — LEVOTHYROXINE SODIUM 50 MCG PO TABS: 50.0000 ug | ORAL_TABLET | Freq: Every day | ORAL | Status: DC

## 2012-03-15 NOTE — Patient Instructions (Addendum)
We will restart  Thyroid replacement at a low dose .    See dermatologist .  About the acne I think this could be helped .     I agree proceeding with drivers  License .  Check TSH in 2-3 months and then  OV.

## 2012-03-15 NOTE — Progress Notes (Signed)
Chief Complaint  Patient presents with  . Follow-up    Meds.  Stopped taking Synthroid but has been told by her gynecologist that she needs to start back on it.    HPI: Patient comes in today for follow up of  multiple medical problems. Med managent she is here with her mother her mom lives in [place] and [place] still remains in the family having [place]. Last ov was 12 12  since that time she has seen a gynecologist within normal evaluation but laboratory tests were done and her TSH was elevated again she's been off her thyroid medicines for while.  Anxiety  hay flares Paxil is helping her a good bit at 20 mg when she ran out for a couple days she didn't feel as well. She denies any specific tremor with this actually helps her feel better although she does shake at times but not felt to be from the medicine Thyroid  Of meds for a while.   Mom also has thyroid disease and is on replacement. She does feel somewhat tired at times in her to concentrate. Her periods are sometimes every other month but last 5-7 days. No unusual headaches or vision changes  Acne is about the same on the face and upper chest has a family history of very severe acne in her father had been to a dermatologist a long time ago it would've her medicine didn't work out. She wasn't able to complete the proactive treatment that she's tried in the past. Her IBS seems to be stable she eats healthy foods tends to get up around noon and good bed at midnight does online activities. She was trying to get her driver's license and forms were sent him to the [facility] but then recurred to apparently a form was flagged because she is on Paxil. Mom and patient both feel that if she could drive she would have increased confidence to do other things job etc. ROS: See pertinent positives and negatives per HPI.  Past Medical History  Diagnosis Date  . Anxiety   . Hypothyroid   . Self mutilation     Family History  Problem Relation Age of  Onset  . Thyroid disease Mother   . Acne Father     History   Social History  . Marital Status: Single    Spouse Name: N/A    Number of Children: N/A  . Years of Education: N/A   Social History Main Topics  . Smoking status: Never Smoker   . Smokeless tobacco: None  . Alcohol Use: No  . Drug Use: No  . Sexually Active:    Other Topics Concern  . None   Social History Narrative   Single   Regular exercise- yes sometimes    ? Tad at home finishing hs on line [employer] to get hs diploma         Lives in her household with 9 cats and dogs likes animals takes care of them.    trying to get her driver's license.      Neg tad     Outpatient Encounter Prescriptions as of 03/15/2012  Medication Sig Dispense Refill  . ondansetron (ZOFRAN) 4 MG tablet Take 4 mg by mouth every 8 (eight) hours as needed.        Marland Kitchen PARoxetine (PAXIL) 20 MG tablet TAKE ONE TABLET BY MOUTH EVERY DAY  90 tablet  3  . [DISCONTINUED] PARoxetine (PAXIL) 20 MG tablet 1 daily.       . [  DISCONTINUED] PARoxetine (PAXIL) 20 MG tablet TAKE ONE TABLET BY MOUTH EVERY DAY  30 tablet  0  . levothyroxine (SYNTHROID, LEVOTHROID) 50 MCG tablet Take 1 tablet (50 mcg total) by mouth daily.  90 tablet  1  . [DISCONTINUED] levothyroxine (SYNTHROID, LEVOTHROID) 75 MCG tablet Take 75 mcg by mouth daily.        . [DISCONTINUED] polyethylene glycol (MIRALAX / GLYCOLAX) packet Take 17 g by mouth daily.         No facility-administered encounter medications on file as of 03/15/2012.    EXAM:  BP 124/82  Pulse 76  Temp(Src) 97.9 F (36.6 C) (Oral)  Ht 5' 4.5" (1.638 m)  Wt 94 lb (42.638 kg)  BMI 15.89 kg/m2  SpO2 99%  LMP 02/29/2012  Body mass index is 15.89 kg/(m^2).  Wt Readings from Last 3 Encounters:  03/15/12 94 lb (42.638 kg)  12/15/10 89 lb (40.37 kg)  07/28/10 90 lb (40.824 kg)     GENERAL: vitals reviewed and listed above, alert, oriented, appears well hydrated and in no acute distress  Good  eye contact mild anxiety good speech   Some increase motor activity   HEENT: atraumatic, conjunctiva  clear, no obvious abnormalities on inspection of external nose and ears OP : no lesion edema or exudate   NECK: no obvious masses on inspection palpation  Non tender   LUNGS: clear to auscultation bilaterally, no wheezes, rales or rhonchi, good air movement  CV: HRRR, no clubbing cyanosis or  peripheral edema nl cap refill   MS: moves all extremities without noticeable focal  abnormality Skin acne scattered   Papular  On face and upper chest some    PSYCH: pleasant and cooperative,  Lab Results  Component Value Date   WBC 6.2 12/15/2010   HGB 14.3 12/15/2010   HCT 42.6 12/15/2010   PLT 240.0 12/15/2010   GLUCOSE 67* 12/15/2010   CHOL 151 04/09/2006   HDL 39.9 04/09/2006   ALT 16 04/23/2009   AST 19 04/23/2009   NA 139 12/15/2010   K 5.1 12/15/2010   CL 102 12/15/2010   CREATININE 0.7 12/15/2010   BUN 14 12/15/2010   CO2 26 12/15/2010   TSH 1.92 12/15/2010    ASSESSMENT AND PLAN:  Discussed the following assessment and plan:  ANXIETY - reasonably stable possible underlying OCD etc. other affective disorder but seems to be quite stable/ personable today. agree with expanding opportunities  Irritable bowel syndrome - stable  constipation  HYPOTHYROIDISM - elevated tsh  8.35 with nl t4 fam hx may do better with low dose and fu disc  use of med and fu  PANIC ATTACK - None recently able to control.  ACNE VULGARIS, FACIAL - disc meds  see derm  thnk it could help and buid confidence  Plan followup in a few months. Discussion about above. We'll begin on low dose thyroid replacement 52 and see how she does we'll try to review her chart about the initial diagnosis but this may have been before this current electronic health record. Refill her Paxil for a year. -Patient advised to return or notify health care team  if symptoms worsen or persist or new concerns arise.  Patient  Instructions  We will restart  Thyroid replacement at a low dose .    See dermatologist .  About the acne I think this could be helped .     I agree proceeding with drivers  License .  Check TSH in 2-3 months  and then  OV.          Neta Mends. Panosh M.D.

## 2012-03-17 ENCOUNTER — Ambulatory Visit: Payer: BC Managed Care – PPO | Admitting: Internal Medicine

## 2012-06-07 ENCOUNTER — Other Ambulatory Visit: Payer: BC Managed Care – PPO

## 2012-06-14 ENCOUNTER — Ambulatory Visit: Payer: BC Managed Care – PPO | Admitting: Internal Medicine

## 2012-07-28 ENCOUNTER — Encounter: Payer: Self-pay | Admitting: Internal Medicine

## 2012-07-28 ENCOUNTER — Other Ambulatory Visit (INDEPENDENT_AMBULATORY_CARE_PROVIDER_SITE_OTHER): Payer: BC Managed Care – PPO

## 2012-07-28 DIAGNOSIS — E039 Hypothyroidism, unspecified: Secondary | ICD-10-CM

## 2012-07-28 LAB — TSH: TSH: 5.25 u[IU]/mL (ref 0.35–5.50)

## 2012-08-03 ENCOUNTER — Ambulatory Visit (INDEPENDENT_AMBULATORY_CARE_PROVIDER_SITE_OTHER): Payer: BC Managed Care – PPO | Admitting: Internal Medicine

## 2012-08-03 ENCOUNTER — Encounter: Payer: Self-pay | Admitting: Internal Medicine

## 2012-08-03 VITALS — BP 100/70 | HR 75 | Temp 97.5°F | Wt 89.0 lb

## 2012-08-03 DIAGNOSIS — F411 Generalized anxiety disorder: Secondary | ICD-10-CM

## 2012-08-03 DIAGNOSIS — R636 Underweight: Secondary | ICD-10-CM

## 2012-08-03 DIAGNOSIS — E039 Hypothyroidism, unspecified: Secondary | ICD-10-CM

## 2012-08-03 NOTE — Patient Instructions (Signed)
Try to take the levothyroxine every day   And check tsh and labs in 6 weeks  We can check blood sugar and hg a1c at that time to make sure there is no dissociate . Skipping meals and having sugar can make you feel  Badly.  And shaky.  Still advise see dermatology about best acne treatment.

## 2012-08-03 NOTE — Progress Notes (Signed)
Chief Complaint  Patient presents with  . Follow-up    Thyroid    HPI: Fu for meds and thyroid .   Since last visit think she's been doing rather well however really hasn't taken her thyroid medicine because she can't remember to take it in the morning and gets up at different times. She did take it today of the test and didn't have any bad effects. She's also been to the gynecologist Had pap smear   Done and ok.   Hasn't been to the dermatologist. Still hasn't heard from [facility] about being able to get a license. She does get out a few times a week. Anxiety she thinks it's stable at this time she did have a stomach virus episode in didn't panic over it and got through it fairly well and felt good about that. States that it could be as she's getting older or the medicine. Takes her Paxil at night. Thinks it's helping.  ROS: See pertinent positives and negatives per HPI. She does get worried about her sugar to sometimes feels jittery sometimes she'll go a day without eating but just drinking fluids.  Past Medical History  Diagnosis Date  . Anxiety   . Hypothyroid   . Self mutilation     Family History  Problem Relation Age of Onset  . Thyroid disease Mother   . Acne Father     History   Social History  . Marital Status: Single    Spouse Name: N/A    Number of Children: N/A  . Years of Education: N/A   Social History Main Topics  . Smoking status: Never Smoker   . Smokeless tobacco: None  . Alcohol Use: No  . Drug Use: No  . Sexually Active:    Other Topics Concern  . None   Social History Narrative   Single   Regular exercise- yes sometimes    ? Tad at home finishing hs on line [employer] to get hs diploma         Lives in her household with 9 cats and dogs likes animals takes care of them.    trying to get her driver's license.      Neg tad     Outpatient Encounter Prescriptions as of 08/03/2012  Medication Sig Dispense Refill  . levothyroxine  (SYNTHROID, LEVOTHROID) 50 MCG tablet Take 1 tablet (50 mcg total) by mouth daily.  90 tablet  1  . PARoxetine (PAXIL) 20 MG tablet TAKE ONE TABLET BY MOUTH EVERY DAY  90 tablet  3  . ondansetron (ZOFRAN) 4 MG tablet Take 4 mg by mouth every 8 (eight) hours as needed.         No facility-administered encounter medications on file as of 08/03/2012.    EXAM:  BP 100/70  Pulse 75  Temp(Src) 97.5 F (36.4 C) (Oral)  Wt 89 lb (40.37 kg)  BMI 15.05 kg/m2  SpO2 98%  LMP 07/20/2012  Body mass index is 15.05 kg/(m^2).  GENERAL: vitals reviewed and listed above, alert, oriented, appears well hydrated and in no acute distress HEENT: atraumatic, conjunctiva  clear, no obvious abnormalities on inspection of external nose and ears  NECK: no obvious masses on inspection palpation  Thyroid palpable no nodules  LUNGS: clear to auscultation bilaterally, no wheezes, rales or rhonchi, good air movement CV: HRRR, no clubbing cyanosis or  peripheral edema nl cap refill  Skin  Mod inflamm acne lower face  Some improved lower extremities hyper pigmented healed areas.  MS: moves all extremities without noticeable focal  abnormality PSYCH: pleasant and cooperative, no obvious depression mildly anxious . No tremor  Lab Results  Component Value Date   TSH 5.25 07/28/2012   Wt Readings from Last 3 Encounters:  08/03/12 89 lb (40.37 kg)  03/15/12 94 lb (42.638 kg)  12/15/10 89 lb (40.37 kg)    ASSESSMENT AND PLAN:  Discussed the following assessment and plan:  HYPOTHYROIDISM  ANXIETY Still missing  Most doses   tsh in 5 + range  Would do better probably with replacement   Disc strategies phone apps etc. irreg sleep causing some of the problems. Anxiety stable at this time. Drivers lis app  hasn't heard  No ci to driving . Still advise acne  Evaluation  don't think she has diabetes but we can get chemistry and A1c at her next visit. Avoid fasting for long periods. After patient last noted that she  did have weight loss of a few pounds.  At her next visit we may see if she is ready to discuss things with the nutritionist that might be helpful. Also it is unclear if the GYN did an anemia screen her other tests.. And I don't see thyroid antibodies in the record. He can also add this on. -Patient advised to return or notify health care team  if symptoms worsen or persist or new concerns arise.  Patient Instructions  Try to take the levothyroxine every day   And check tsh and labs in 6 weeks  We can check blood sugar and hg a1c at that time to make sure there is no dissociate . Skipping meals and having sugar can make you feel  Badly.  And shaky.  Still advise see dermatology about best acne treatment.   Neta Mends. Panosh M.D.

## 2012-08-04 ENCOUNTER — Other Ambulatory Visit: Payer: Self-pay | Admitting: Family Medicine

## 2012-08-04 DIAGNOSIS — R636 Underweight: Secondary | ICD-10-CM

## 2012-08-04 DIAGNOSIS — E079 Disorder of thyroid, unspecified: Secondary | ICD-10-CM

## 2013-05-14 ENCOUNTER — Encounter (HOSPITAL_COMMUNITY): Payer: Self-pay | Admitting: Emergency Medicine

## 2013-05-14 ENCOUNTER — Emergency Department (INDEPENDENT_AMBULATORY_CARE_PROVIDER_SITE_OTHER)
Admission: EM | Admit: 2013-05-14 | Discharge: 2013-05-14 | Disposition: A | Discharge status: Home / Self Care | Payer: BC Managed Care – PPO | Source: Home / Self Care

## 2013-05-14 DIAGNOSIS — R140 Abdominal distension (gaseous): Secondary | ICD-10-CM

## 2013-05-14 DIAGNOSIS — R197 Diarrhea, unspecified: Secondary | ICD-10-CM

## 2013-05-14 DIAGNOSIS — R143 Flatulence: Secondary | ICD-10-CM

## 2013-05-14 DIAGNOSIS — R141 Gas pain: Secondary | ICD-10-CM

## 2013-05-14 DIAGNOSIS — R142 Eructation: Secondary | ICD-10-CM

## 2013-05-14 LAB — POCT H PYLORI SCREEN: H. PYLORI SCREEN, POC: NEGATIVE

## 2013-05-14 MED ORDER — FAMOTIDINE 20 MG PO TABS
ORAL_TABLET | ORAL | Status: AC
  Filled 2013-05-14: qty 1

## 2013-05-14 MED ORDER — ONDANSETRON 4 MG PO TBDP
ORAL_TABLET | ORAL | Status: AC
  Filled 2013-05-14: qty 1

## 2013-05-14 MED ORDER — ONDANSETRON 4 MG PO TBDP
4.0000 mg | ORAL_TABLET | Freq: Once | ORAL | Status: AC
  Administered 2013-05-14: 4 mg via ORAL

## 2013-05-14 MED ORDER — GI COCKTAIL ~~LOC~~
30.0000 mL | Freq: Once | ORAL | Status: AC
  Administered 2013-05-14: 30 mL via ORAL

## 2013-05-14 MED ORDER — GI COCKTAIL ~~LOC~~
ORAL | Status: AC
  Filled 2013-05-14: qty 30

## 2013-05-14 MED ORDER — FAMOTIDINE 20 MG PO TABS
20.0000 mg | ORAL_TABLET | Freq: Once | ORAL | Status: AC
  Administered 2013-05-14: 20 mg via ORAL

## 2013-05-14 NOTE — Discharge Instructions (Signed)
Bloating Omeprazole 20 mg a day Probiotics Water Pedialyte Bloating is the feeling of fullness in your belly. You may feel as though your pants are too tight. Often the cause of bloating is overeating, retaining fluids, or having gas in your bowel. It is also caused by swallowing air and eating foods that cause gas. Irritable bowel syndrome is one of the most common causes of bloating. Constipation is also a common cause. Sometimes more serious problems can cause bloating. SYMPTOMS  Usually there is a feeling of fullness, as though your abdomen is bulged out. There may be mild discomfort.  DIAGNOSIS  Usually no particular testing is necessary for most bloating. If the condition persists and seems to become worse, your caregiver may do additional testing.  TREATMENT   There is no direct treatment for bloating.  Do not put gas into the bowel. Avoid chewing gum and sucking on candy. These tend to make you swallow air. Swallowing air can also be a nervous habit. Try to avoid this.  Avoiding high residue diets will help. Eat foods with soluble fibers (examples include root vegetables, apples, or barley) and substitute dairy products with soy and rice products. This helps irritable bowel syndrome.  If constipation is the cause, then a high residue diet with more fiber will help.  Avoid carbonated beverages.  Over-the-counter preparations are available that help reduce gas. Your pharmacist can help you with this. SEEK MEDICAL CARE IF:   Bloating continues and seems to be getting worse.  You notice a weight gain.  You have a weight loss but the bloating is getting worse.  You have changes in your bowel habits or develop nausea or vomiting. SEEK IMMEDIATE MEDICAL CARE IF:   You develop shortness of breath or swelling in your legs.  You have an increase in abdominal pain or develop chest pain. Document Released: 10/22/2005 Document Revised: 03/16/2011 Document Reviewed:  12/10/2006 [facility] Patient Information 2014 [place], [place].  Abdominal Pain, Adult Many things can cause belly (abdominal) pain. Most times, the belly pain is not dangerous. Many cases of belly pain can be watched and treated at home. HOME CARE   Do not take medicines that help you go poop (laxatives) unless told to by your doctor.  Only take medicine as told by your doctor.  Eat or drink as told by your doctor. Your doctor will tell you if you should be on a special diet. GET HELP IF:  You do not know what is causing your belly pain.  You have belly pain while you are sick to your stomach (nauseous) or have runny poop (diarrhea).  You have pain while you pee or poop.  Your belly pain wakes you up at night.  You have belly pain that gets worse or better when you eat.  You have belly pain that gets worse when you eat fatty foods. GET HELP RIGHT AWAY IF:   The pain does not go away within 2 hours.  You have a fever.  You keep throwing up (vomiting).  The pain changes and is only in the right or left part of the belly.  You have bloody or tarry looking poop. MAKE SURE YOU:   Understand these instructions.  Will watch your condition.  Will get help right away if you are not doing well or get worse. Document Released: 06/10/2007 Document Revised: 10/12/2012 Document Reviewed: 08/31/2012 [facility] Patient Information 2014 [place], [place].  Flatulence There are good germs in your gut to help you digest food. Gas  is produced by these germs and released from your bottom. Most people release 3 to 4 quarts of gas every day. This is normal. HOME CARE  Eat or drink less of the foods or liquids that give you gas.  Take the time to chew your food well. Talk less while you eat.  Do not suck on ice or hard candy.  Sip slowly. Stir some of the bubbles out of fizzy drinks with a spoon or straw.  Avoid chewing gum or smoking.  Ask your doctor about liquids and tablets that  may help control burping and gas.  Only take medicine as told by your doctor. GET HELP RIGHT AWAY IF:   There is discomfort when you burp or pass gas.  You throw up (vomit) when you burp.  Poop (stool) comes out when you pass gas.  Your belly is puffy (swollen) and hard. MAKE SURE YOU:   Understand these instructions.  Will watch your condition.  Will get help right away if you are not doing well or get worse. Document Released: 10/25/2007 Document Revised: 03/16/2011 Document Reviewed: 10/25/2007 [facility]Exit[facility] Patient Information 2014 [place]ExitCare, [place]LL[place].

## 2013-05-14 NOTE — ED Provider Notes (Signed)
CSN: [id number]     Arrival date & time 05/14/13  0924 History   First MD Initiated Contact with Patient 05/14/13 [phone]     Chief Complaint  Patient presents with  . Abdominal Pain   (Consider location/radiation/quality/duration/timing/severity/associated sxs/prior Treatment) HPI Comments: Approx 8-9 days ago with bloating, much gas and flatulence, increased bowel sounds and today diarrhea stools. N no vomiting. She also has cats.    Past Medical History  Diagnosis Date  . Anxiety   . Hypothyroid   . Self mutilation    History reviewed. No pertinent past surgical history. Family History  Problem Relation Age of Onset  . Thyroid disease Mother   . Acne Father    History  Substance Use Topics  . Smoking status: Never Smoker   . Smokeless tobacco: Not on file  . Alcohol Use: No   OB History   Grav Para Term Preterm Abortions TAB SAB Ect Mult Living                 Review of Systems  Constitutional: Positive for activity change and appetite change. Negative for fever and fatigue.  HENT: Negative.   Respiratory: Negative.   Gastrointestinal: Positive for nausea and diarrhea. Negative for constipation, blood in stool, abdominal distention and rectal pain.  Genitourinary: Negative.   Skin: Negative for rash.  Neurological: Negative.     Allergies  Review of patient's allergies indicates no known allergies.  Home Medications   Prior to Admission medications   Medication Sig Start Date End Date Taking? Authorizing Provider  levothyroxine (SYNTHROID, LEVOTHROID) 50 MCG tablet Take 1 tablet (50 mcg total) by mouth daily. 03/15/12   Madelin HeadingsWanda K Panosh, MD  ondansetron (ZOFRAN) 4 MG tablet Take 4 mg by mouth every 8 (eight) hours as needed.      Historical Provider, MD  PARoxetine (PAXIL) 20 MG tablet TAKE ONE TABLET BY MOUTH EVERY DAY 03/15/12   Madelin HeadingsWanda K Panosh, MD   BP 138/92  Pulse 80  Temp(Src) 97.4 F (36.3 C) (Oral)  Resp 17  SpO2 97% Physical Exam  Nursing note and  vitals reviewed. Constitutional: She is oriented to person, place, and time. She appears well-developed and well-nourished. No distress.  Eyes: EOM are normal.  Neck: Normal range of motion. Neck supple.  Cardiovascular: Normal rate, regular rhythm and normal heart sounds.   Pulmonary/Chest: Effort normal and breath sounds normal. No respiratory distress. She has no wheezes. She has no rales.  Abdominal: Soft. Bowel sounds are normal. She exhibits no distension and no mass. There is tenderness. There is no rebound and no guarding.  Mild epigastric tenderness  Musculoskeletal: She exhibits no edema.  Neurological: She is alert and oriented to person, place, and time.  Skin: Skin is warm and dry.  Psychiatric: She has a normal mood and affect.    ED Course  Procedures (including critical care time) Labs Review Labs Reviewed  POCT H PYLORI SCREEN    Imaging Review No results found.   MDM   1. Postprandial abdominal bloating   2. Flatulence/gas pain/belching   3. Diarrhea in adult patient    Increase clear liquids  information on GI issues to include parasites Collect stool sample at home and bring back in as directed. Omeprazole and Probiotics        Hayden Rasmussenavid Emrys Mckamie, NP 05/14/13 1052

## 2013-05-14 NOTE — ED Provider Notes (Signed)
Medical screening examination/treatment/procedure(s) were performed by a resident physician or non-physician practitioner and as the supervising physician I was immediately available for consultation/collaboration.  Analilia Geddis, MD    Olamide Carattini S Idaliz Tinkle, MD 05/14/13 1233 

## 2013-05-14 NOTE — ED Notes (Signed)
Patient states has been having stomach issues since last week States finished her monthly cycle last week and began experiencing Some bloating, feels gassy, has been having bouts of diarrhea Denies any fever

## 2013-05-17 ENCOUNTER — Encounter: Payer: Self-pay | Admitting: Internal Medicine

## 2013-05-17 ENCOUNTER — Ambulatory Visit (INDEPENDENT_AMBULATORY_CARE_PROVIDER_SITE_OTHER): Payer: BC Managed Care – PPO | Admitting: Internal Medicine

## 2013-05-17 ENCOUNTER — Telehealth: Payer: Self-pay | Admitting: Internal Medicine

## 2013-05-17 VITALS — BP 94/60 | HR 92 | Temp 98.9°F | Ht 64.5 in | Wt 89.0 lb

## 2013-05-17 DIAGNOSIS — E079 Disorder of thyroid, unspecified: Secondary | ICD-10-CM

## 2013-05-17 DIAGNOSIS — R109 Unspecified abdominal pain: Secondary | ICD-10-CM

## 2013-05-17 MED ORDER — METRONIDAZOLE 500 MG PO TABS: 500.0000 mg | ORAL_TABLET | Freq: Two times a day (BID) | ORAL | Status: DC

## 2013-05-17 NOTE — Progress Notes (Signed)
Chief Complaint  Patient presents with  . Follow-up    Post Hospital    HPI: Patient comes in today for SDA for  new problem evaluation. Patient come in for follow up from ED visit on 5 10 15  for epigastric pain and post prandial bloating .  Onset like queasiness  Had neg h pylori screen and stool tests orders but ? 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  Last night again. doubled over with crampss butno bm  No one else sick .   Usually goes 2 x per week. Had diarrhea  1 day   kinda green runny.watery   Non since then. Crackers and  applesause and pedialite  Fluids seem ok . When tries to eat more comes back  ROS: See pertinent positives and negatives per HPI.No cp sob   lmo 1-2 weeks ago no chance of pregnancy no SA .  No uti sx .   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   Lives aloneHas cats  16 .  Rabies immunization   Could it be a parasite?    Mom just had GB out .  Past Medical History  Diagnosis Date  . Anxiety   . Hypothyroid   . Self mutilation   . SELF MUTILATION 03/08/2007    Qualifier: History of  By: Fabian SharpPanosh MD, Neta MendsWanda K     Family History  Problem Relation Age of Onset  . Thyroid disease Mother   . Acne Father     History   Social History  . Marital Status: Single    Spouse Name: N/A    Number of Children: N/A  . Years of Education: N/A   Social History Main Topics  . Smoking status: Never Smoker   . Smokeless tobacco: None  . Alcohol Use: No  . Drug Use: No  . Sexual Activity:    Other Topics Concern  . None   Social History Narrative   Single   Regular exercise- yes sometimes    ? Tad at home finishing hs on line [employer] to get hs diploma         Lives in her household with 9 cats and dogs likes animals takes care of them.    trying to get her driver's license.      Neg tad     Outpatient  Encounter Prescriptions as of 05/17/2013  Medication Sig  . ondansetron (ZOFRAN) 4 MG tablet Take 4 mg by mouth every 8 (eight) hours as needed.    Marland Kitchen. PARoxetine (PAXIL) 20 MG tablet TAKE ONE TABLET BY MOUTH EVERY DAY  . metroNIDAZOLE (FLAGYL) 500 MG tablet Take 1 tablet (500 mg total) by mouth 2 (two) times daily.  . [DISCONTINUED] levothyroxine (SYNTHROID, LEVOTHROID) 50 MCG tablet Take 1 tablet (50 mcg total) by mouth daily.    EXAM:  BP 94/60  Pulse 92  Temp(Src) 98.9 F (37.2 C) (Oral)  Ht 5' 4.5" (1.638 m)  Wt 89 lb (40.37 kg)  BMI 15.05 kg/m2  SpO2 98%  LMP 05/12/2013  Body mass index is 15.05 kg/(m^2).  GENERAL: vitals reviewed and listed above, alert, oriented, appears well hydrated and in no acute distress HEENT: atraumatic, conjunctiva  clear, no obvious abnormalities on inspection of external nose and ears OP : no lesion edema or exudate  NECK: no obvious  masses on inspection palpation  LUNGS: clear to auscultation bilaterally, no wheezes, rales or rhonchi, good air movement CV: HRRR, no clubbing cyanosis or  peripheral edema nl cap refill  Abdomen:  Sof,t present  bowel sounds without hepatosplenomegaly, no guarding rebound or masses no CVA tenderness points to mid abd epigastrium lower  Non focal  MS: moves all extremities without noticeable focal  abnormality PSYCH: pleasant and cooperative, no obvious depression or anxiety Wt Readings from Last 3 Encounters:  05/17/13 89 lb (40.37 kg)  08/03/12 89 lb (40.37 kg)  03/15/12 94 lb (42.638 kg)    ASSESSMENT AND PLAN:  Discussed the following assessment and plan:  Abdominal cramps - could be gi infection and convalescing no stool sample produced labs emopiric rx flagyl consdier us other eval - Plan: CBC with Differential, Hepatic function panel, Basic metabolic panel, TSH, Celiac panel 10, T4, free, POCT urinalysis dipstick, Sedimentation rate  Abdominal pain - Plan: CBC with Differential, Hepatic function panel,  Basic metabolic panel, TSH, Celiac panel 10, T4, free, POCT urinalysis dipstick, Sedimentation rate  Thyroid disease - Plan: CBC with Differential, Hepatic function panel, Basic metabolic panel, TSH, Celiac panel 10, T4, free, POCT urinalysis dipstick, Sedimentation rate Consider us  Other eval if not better  Stool test  Has lots of cats no known parasite not top of list for dx but can cover for giardia and or overgrowth. Se of med explained anxious to get back to work and not lose her job -Patient advised to return or notify health care team  if symptoms worsen ,persist or new concerns arise.  Patient Instructions  This could still be  A  Bowel infection.  And just not better . Stool test is a better way.  But don/t have .  Results,  Lab today . Can begin a trial of metronidazole  In the meantime. For some types of bowel infections.    Neta MendsWanda K. Brownie Gockel M.D.  Pre visit review using our clinic review tool, if applicable. No additional management support is needed unless otherwise documented below in the visit note.

## 2013-05-17 NOTE — Patient Instructions (Addendum)
This could still be  A  Bowel infection.  And just not better . Stool test is a better way.  But don/t have .  Results,  Lab today . Can begin a trial of metronidazole  In the meantime. For some types of bowel infections.

## 2013-05-17 NOTE — Telephone Encounter (Signed)
Noted  

## 2013-05-17 NOTE — Telephone Encounter (Signed)
Patient Information:  Caller Name: Morrie Sheldonshley  Phone: [phone]  Patient: Taylor Swanson, Taylor Swanson  Gender: Female  DOB: [birth year]  Age: 28 Years  PCP: Taylor Swanson, Taylor Swanson [facility](Family Practice)  Pregnant: No  Office Follow Up:  Does the office need to follow up with this patient?: No  Instructions For The Office: N/A  RN Note:  Pt states she has appt today at 1600.  Symptoms  Reason For Call & Symptoms: Mom reports she has abdominal cramping and nausea after every meal lasting  2 hours or more.  Reviewed Health History In EMR: Yes  Reviewed Medications In EMR: Yes  Reviewed Allergies In EMR: Yes  Reviewed Surgeries / Procedures: Yes  Date of Onset of Symptoms: 05/11/2013 OB / GYN:  LMP: 05/15/2013  Guideline(s) Used:  Abdominal Pain - Female  Disposition Per Guideline:   See Today in Office  Reason For Disposition Reached:   Moderate or mild pain that comes and goes (cramps) lasts > 24 hours  Advice Given:  Call Back If:  You become worse.  Patient Will Follow Care Advice:  YES  Appointment Scheduled:  05/17/2013 16:00:00 Appointment Scheduled Provider:  Berniece Swanson, Taylor Swanson [facility](Family Practice)

## 2013-05-18 LAB — CELIAC PANEL 10
ENDOMYSIAL SCREEN: NEGATIVE
Gliadin IgA: 10.3 U/mL (ref <20)
Gliadin IgG: 30.5 U/mL — ABNORMAL HIGH (ref <20)
IGA: 171 mg/dL (ref 69–380)
TISSUE TRANSGLUTAMINASE AB, IGA: 5.4 U/mL (ref <20)
Tissue Transglut Ab: 12.6 U/mL (ref <20)

## 2013-05-18 LAB — CBC WITH DIFFERENTIAL/PLATELET
BASOS PCT: 0.5 % (ref 0.0–3.0)
Basophils Absolute: 0 10*3/uL (ref 0.0–0.1)
EOS PCT: 5.6 % — AB (ref 0.0–5.0)
Eosinophils Absolute: 0.4 10*3/uL (ref 0.0–0.7)
HEMATOCRIT: 44.9 % (ref 36.0–46.0)
Hemoglobin: 14.9 g/dL (ref 12.0–15.0)
LYMPHS ABS: 2.4 10*3/uL (ref 0.7–4.0)
Lymphocytes Relative: 32.4 % (ref 12.0–46.0)
MCHC: 33.2 g/dL (ref 30.0–36.0)
MCV: 88.7 fl (ref 78.0–100.0)
MONO ABS: 0.4 10*3/uL (ref 0.1–1.0)
Monocytes Relative: 5.6 % (ref 3.0–12.0)
NEUTROS ABS: 4.1 10*3/uL (ref 1.4–7.7)
Neutrophils Relative %: 55.9 % (ref 43.0–77.0)
PLATELETS: 273 10*3/uL (ref 150.0–400.0)
RBC: 5.07 Mil/uL (ref 3.87–5.11)
RDW: 13.6 % (ref 11.5–15.5)
WBC: 7.4 10*3/uL (ref 4.0–10.5)

## 2013-05-18 LAB — BASIC METABOLIC PANEL
BUN: 8 mg/dL (ref 6–23)
CO2: 27 mEq/L (ref 19–32)
Calcium: 9.5 mg/dL (ref 8.4–10.5)
Chloride: 101 mEq/L (ref 96–112)
Creatinine, Ser: 0.8 mg/dL (ref 0.4–1.2)
GFR: 93.87 mL/min (ref >60.00)
Glucose, Bld: 69 mg/dL — ABNORMAL LOW (ref 70–99)
Potassium: 3.8 mEq/L (ref 3.5–5.1)
SODIUM: 137 meq/L (ref 135–145)

## 2013-05-18 LAB — HEPATIC FUNCTION PANEL
ALT: 13 U/L (ref 0–35)
AST: 23 U/L (ref 0–37)
Albumin: 4.9 g/dL (ref 3.5–5.2)
Alkaline Phosphatase: 61 U/L (ref 39–117)
BILIRUBIN TOTAL: 0.9 mg/dL (ref 0.2–1.2)
Bilirubin, Direct: 0.1 mg/dL (ref 0.0–0.3)
TOTAL PROTEIN: 8.1 g/dL (ref 6.0–8.3)

## 2013-05-18 LAB — T4, FREE: Free T4: 0.9 ng/dL (ref 0.60–1.60)

## 2013-05-18 LAB — SEDIMENTATION RATE: Sed Rate: 5 mm/hr (ref 0–22)

## 2013-05-18 LAB — TSH: TSH: 0.99 u[IU]/mL (ref 0.35–4.50)

## 2013-08-28 ENCOUNTER — Telehealth: Payer: Self-pay | Admitting: Internal Medicine

## 2013-08-28 MED ORDER — PAROXETINE HCL 20 MG PO TABS: ORAL_TABLET | ORAL | Status: DC

## 2013-08-28 NOTE — Telephone Encounter (Signed)
dont want her to run out will send in meds  90 days  Have her make appt    Before runs out.

## 2013-08-28 NOTE — Telephone Encounter (Signed)
Pt is needing new rx PARoxetine (PAXIL) 20 MG tablet sent to [facility] on [address], pt states she is completely out and would like to pick it up today. States pharmacy informed her that they could not get through.

## 2013-08-28 NOTE — Telephone Encounter (Signed)
Patient not seen for this problem in over a year.  Please advise.  Thanks!

## 2013-08-29 NOTE — Telephone Encounter (Signed)
Per [facility], please make an appt with this patient in the next 90 days (before refill runs out).  Thanks!

## 2013-08-31 NOTE — Telephone Encounter (Signed)
Pt states she will have her mom call and schedule the appointment

## 2014-01-03 ENCOUNTER — Other Ambulatory Visit: Payer: Self-pay | Admitting: Internal Medicine

## 2014-01-04 ENCOUNTER — Telehealth: Payer: Self-pay | Admitting: Family Medicine

## 2014-01-04 NOTE — Telephone Encounter (Signed)
Ok to refill x 90 days  Have her make appt to be completed before runs out

## 2014-01-04 NOTE — Telephone Encounter (Signed)
vm has not been set up

## 2014-01-04 NOTE — Telephone Encounter (Signed)
There are  L[place] of openings on wednesdays in jan feb

## 2014-01-04 NOTE — Telephone Encounter (Signed)
Pt needs an appt within the next 90 days [facility]  Please help her to get a follow up appt.  Thanks!

## 2014-01-04 NOTE — Telephone Encounter (Signed)
Sent to the pharmacy by e-scribe.  Will send a message to scheduling to help the pt get on the schedule. 

## 2014-01-09 NOTE — Telephone Encounter (Signed)
vm has not been set up °

## 2014-01-12 NOTE — Telephone Encounter (Addendum)
Pt vm has not been set up misty you may want to mail pt a letter. Pt mom # is d/c and lmom at dad #

## 2014-01-23 NOTE — Telephone Encounter (Signed)
Letter sent by mail

## 2014-02-06 ENCOUNTER — Telehealth: Payer: Self-pay | Admitting: Internal Medicine

## 2014-02-06 NOTE — Telephone Encounter (Signed)
i agree send 30 days to her pharmacy

## 2014-02-06 NOTE — Telephone Encounter (Signed)
Pt request refill PARoxetine (PAXIL) 20 MG tablet  Pt made appt for 2/9 but does not have enough med to get her through,  30 days sent to [facility]

## 2014-02-07 MED ORDER — PAROXETINE HCL 20 MG PO TABS: 20.0000 mg | ORAL_TABLET | Freq: Every day | ORAL | Status: DC

## 2014-02-07 NOTE — Telephone Encounter (Signed)
Sent to the pharmacy by e-scribe. 

## 2014-02-13 ENCOUNTER — Ambulatory Visit (INDEPENDENT_AMBULATORY_CARE_PROVIDER_SITE_OTHER): Payer: Self-pay | Admitting: Internal Medicine

## 2014-02-13 ENCOUNTER — Encounter: Payer: Self-pay | Admitting: Internal Medicine

## 2014-02-13 VITALS — BP 90/60 | Temp 98.5°F | Ht 64.5 in | Wt 91.8 lb

## 2014-02-13 DIAGNOSIS — Z598 Other problems related to housing and economic circumstances: Secondary | ICD-10-CM

## 2014-02-13 DIAGNOSIS — J069 Acute upper respiratory infection, unspecified: Secondary | ICD-10-CM

## 2014-02-13 DIAGNOSIS — L708 Other acne: Secondary | ICD-10-CM

## 2014-02-13 DIAGNOSIS — F410 Panic disorder [episodic paroxysmal anxiety] without agoraphobia: Secondary | ICD-10-CM

## 2014-02-13 DIAGNOSIS — F419 Anxiety disorder, unspecified: Secondary | ICD-10-CM | POA: Insufficient documentation

## 2014-02-13 DIAGNOSIS — Z5989 Other problems related to housing and economic circumstances: Secondary | ICD-10-CM

## 2014-02-13 DIAGNOSIS — Z79899 Other long term (current) drug therapy: Secondary | ICD-10-CM

## 2014-02-13 MED ORDER — PAROXETINE HCL 20 MG PO TABS: 20.0000 mg | ORAL_TABLET | Freq: Every day | ORAL | Status: DC

## 2014-02-13 MED ORDER — ONDANSETRON HCL 4 MG PO TABS: 4.0000 mg | ORAL_TABLET | Freq: Three times a day (TID) | ORAL | Status: DC | PRN

## 2014-02-13 NOTE — Patient Instructions (Signed)
Continue  medication    Daily  Counseling if possible   Or family support .   Hot fluids liquids  advil if needed ( not looking bacterial  ) rest for now .   Check up when   You can .

## 2014-02-13 NOTE — Progress Notes (Signed)
Pre visit review using our clinic review tool, if applicable. No additional management support is needed unless otherwise documented below in the visit note.  Chief Complaint  Patient presents with  . Follow-up    HPI: Taylor Swanson 29 y.o.  Here with  Aunt   For med check , Also has early uri scratchy st and achy  .  Family has this  No fever .  Paxil 20 per   Psych   Depressive sx.   Med helps .   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  Still working on this  no insurance as ages out and was planning on job with insurance .   Not   Suicidal   ROS: See pertinent positives and negatives per HPI.  Some stomach  nausea with ha and illness  Ask s for refill zofran  Has worked in the past   Past Medical History  Diagnosis Date  . Anxiety   . Hypothyroid   . Self mutilation   . SELF MUTILATION 03/08/2007    Qualifier: History of  By: Fabian Sharp MD, Neta Mends     Family History  Problem Relation Age of Onset  . Thyroid disease Mother   . Acne Father     History   Social History  . Marital Status: Single    Spouse Name: N/A    Number of Children: N/A  . Years of Education: N/A   Social History Main Topics  . Smoking status: Never Smoker   . Smokeless tobacco: None  . Alcohol Use: No  . Drug Use: No  . Sexual Activity: None   Other Topics Concern  . None   Social History Narrative   Single   Regular exercise- yes sometimes    ? Tad at home finishing hs on line [employer] to get hs diploma         Lives in her household with 9 cats and dogs likes animals takes care of them.    trying to get her driver's license.      Neg tad     Outpatient Encounter Prescriptions as of 02/13/2014  Medication Sig  . omeprazole (PRILOSEC OTC) 20 MG tablet Take 20 mg by mouth daily.  . ondansetron (ZOFRAN) 4 MG tablet Take 1 tablet (4 mg total) by mouth every 8 (eight) hours as needed.  Marland Kitchen PARoxetine (PAXIL)  20 MG tablet Take 1 tablet (20 mg total) by mouth daily.  . [DISCONTINUED] ondansetron (ZOFRAN) 4 MG tablet Take 4 mg by mouth every 8 (eight) hours as needed.    . [DISCONTINUED] PARoxetine (PAXIL) 20 MG tablet Take 1 tablet (20 mg total) by mouth daily.  . [DISCONTINUED] metroNIDAZOLE (FLAGYL) 500 MG tablet Take 1 tablet (500 mg total) by mouth 2 (two) times daily.    EXAM:  BP 90/60 mmHg  Temp(Src) 98.5 F (36.9 C) (Oral)  Ht 5' 4.5" (1.638 m)  Wt 91 lb 12.8 oz (41.64 kg)  BMI 15.52 kg/m2  Body mass index is 15.52 kg/(m^2).  GENERAL: vitals reviewed and listed above, alert, oriented, appears well hydrated and in no acute distress min erythema tms clear  HEENT: atraumatic, conjunctiva  clear, no obvious abnormalities on inspection of external nose and ears OP : no lesion edema or exudate  NECK: no obvious masses on inspection palpation  LUNGS: clear to auscultation  bilaterally, no wheezes, rales or rhonchi, good air movement CV: HRRR, no clubbing cyanosis or  peripheral edema nl cap refill  MS: moves all extremities without noticeable focal  abnormality PSYCH: pleasant and cooperative,  Mild anxiety nl speech  Aunt in room  Skin inflammatory acne face  About 15 lesions   Lab Results  Component Value Date   WBC 7.4 05/17/2013   HGB 14.9 05/17/2013   HCT 44.9 05/17/2013   PLT 273.0 05/17/2013   GLUCOSE 69* 05/17/2013   CHOL 151 04/09/2006   HDL 39.9 04/09/2006   ALT 13 05/17/2013   AST 23 05/17/2013   NA 137 05/17/2013   K 3.8 05/17/2013   CL 101 05/17/2013   CREATININE 0.8 05/17/2013   BUN 8 05/17/2013   CO2 27 05/17/2013   TSH 0.99 05/17/2013    ASSESSMENT AND PLAN:  Discussed the following assessment and plan:  PANIC ATTACK  Underweight  ACNE VULGARIS, FACIAL  Medication management  -Patient advised to return or notify health care team  if symptoms worsen ,persist or new concerns arise.  Patient Instructions  Continue  medication    Daily  Counseling if  possible   Or family support .   Hot fluids liquids  advil if needed ( not looking bacterial  ) rest for now .   Check up when   You can .      Neta MendsWanda K. Tabius Rood M.D.

## 2015-03-05 ENCOUNTER — Telehealth: Payer: Self-pay | Admitting: Family Medicine

## 2015-03-05 ENCOUNTER — Other Ambulatory Visit: Payer: Self-pay | Admitting: Internal Medicine

## 2015-03-05 ENCOUNTER — Other Ambulatory Visit: Payer: Self-pay | Admitting: Family Medicine

## 2015-03-05 DIAGNOSIS — Z0000 Encounter for general adult medical examination without abnormal findings: Secondary | ICD-10-CM

## 2015-03-05 NOTE — Telephone Encounter (Signed)
Pt now due for lab work and CPX.  I have placed the lab orders.  Please help her to make both appointments.  Thanks!

## 2015-03-05 NOTE — Telephone Encounter (Signed)
lmom for pt to call back

## 2015-03-05 NOTE — Telephone Encounter (Signed)
Sent to the pharmacy for 90 days.  Message sent to scheduling to help her make lab and cpx appointments.

## 2015-03-08 NOTE — Telephone Encounter (Signed)
lmom for pt to call back

## 2015-03-13 NOTE — Telephone Encounter (Signed)
Pt has been sch

## 2015-06-21 ENCOUNTER — Other Ambulatory Visit: Payer: Self-pay | Admitting: Internal Medicine

## 2015-06-21 NOTE — Telephone Encounter (Signed)
Sent to the pharmacy by e-scribe.  Pt has cpx scheduled for 07/03/15

## 2015-06-26 ENCOUNTER — Other Ambulatory Visit: Payer: Self-pay

## 2015-07-03 ENCOUNTER — Encounter: Payer: Self-pay | Admitting: Internal Medicine

## 2015-07-03 DIAGNOSIS — Z0289 Encounter for other administrative examinations: Secondary | ICD-10-CM

## 2015-07-03 NOTE — Progress Notes (Signed)
Document opened and reviewed for wellness visit . No showed . Has no insurance

## 2015-09-30 ENCOUNTER — Other Ambulatory Visit: Payer: Self-pay | Admitting: Internal Medicine

## 2015-10-01 NOTE — Telephone Encounter (Signed)
Denied.  Pt need an appointment.

## 2015-10-07 ENCOUNTER — Encounter: Payer: Self-pay | Admitting: Internal Medicine

## 2015-10-07 ENCOUNTER — Ambulatory Visit (INDEPENDENT_AMBULATORY_CARE_PROVIDER_SITE_OTHER): Payer: Self-pay | Admitting: Internal Medicine

## 2015-10-07 VITALS — BP 118/70 | HR 82 | Temp 98.0°F | Resp 16 | Ht 63.47 in | Wt 95.2 lb

## 2015-10-07 DIAGNOSIS — F410 Panic disorder [episodic paroxysmal anxiety] without agoraphobia: Secondary | ICD-10-CM

## 2015-10-07 DIAGNOSIS — H547 Unspecified visual loss: Secondary | ICD-10-CM

## 2015-10-07 DIAGNOSIS — Z79899 Other long term (current) drug therapy: Secondary | ICD-10-CM

## 2015-10-07 DIAGNOSIS — R636 Underweight: Secondary | ICD-10-CM

## 2015-10-07 DIAGNOSIS — K644 Residual hemorrhoidal skin tags: Secondary | ICD-10-CM

## 2015-10-07 DIAGNOSIS — Z0000 Encounter for general adult medical examination without abnormal findings: Secondary | ICD-10-CM

## 2015-10-07 MED ORDER — ONDANSETRON HCL 4 MG PO TABS: 4.0000 mg | ORAL_TABLET | Freq: Three times a day (TID) | ORAL | 1 refills | Status: DC | PRN

## 2015-10-07 MED ORDER — PAROXETINE HCL 20 MG PO TABS
20.0000 mg | ORAL_TABLET | Freq: Every day | ORAL | 3 refills | Status: DC
Start: 2015-10-07 — End: 2017-01-09

## 2015-10-07 NOTE — Progress Notes (Signed)
Chief Complaint  Patient presents with  . Annual Exam    rectal itching, medication refill, visial problem left eye    HPI: Taylor Swanson 30 y.o.  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 Works about 40 hours a week. Lives alone with 2 cats. Needs refill of her Paxil for anxiety attacks are pretty well controlled and hasn't needed the Zofran anytime recently. Is not taking the Prilosec.  LIFESTYLE:  Exercise:   Tobacco/ETS:no Alcohol: ocass Sugar beverages: Tends to get into them when she is at work and forgets to eat sometimes. Sleep:ok 8 hours  Drug use: no Work 40 - 48 hours per week  Had pap at [facility]  Menses about 4-6 days  About once a month  Still hasn't taken the driver's test afraid that she might fail it.  ROS: See pertinent positives and negatives per HPI. 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 Has occasional anal itching some tags that mom thinks are hemorrhoids.  Past Medical History:  Diagnosis Date  . Anxiety   . Hypothyroid   . Self mutilation   . SELF MUTILATION 03/08/2007   Qualifier: History of  By: Fabian Sharp MD, Neta Mends     Family History  Problem Relation Age of Onset  . Thyroid disease Mother   . Acne Father     Social History   Social History  . Marital status: Single    Spouse name: N/A  . Number of children: N/A  . Years of education: N/A   Social History Main Topics  . Smoking status: Never Smoker  . Smokeless tobacco: Never Used  . Alcohol use No  . Drug use: No  . Sexual activity: No   Other Topics Concern  . None   Social History Narrative   Single   Regular exercise- yes sometimes    ? Tad at home finishing hs on line [employer] to get hs diploma         Lives in her household with 9 cats  and dogs likes animals takes care of them.    trying to get her driver's license.      Neg tad     Outpatient Medications Prior to Visit  Medication Sig Dispense Refill  . omeprazole (PRILOSEC OTC) 20 MG tablet Take 20 mg by mouth daily.    Marland Kitchen PARoxetine (PAXIL) 20 MG tablet TAKE ONE TABLET BY MOUTH ONCE DAILY 90 tablet 0  . ondansetron (ZOFRAN) 4 MG tablet Take 1 tablet (4 mg total) by mouth every 8 (eight) hours as needed. (Patient not taking: Reported on 10/07/2015) 20 tablet 1   No facility-administered medications prior to visit.      EXAM:  BP 118/70 (BP Location: Right Arm, Patient Position: Sitting, Cuff Size: Normal)   Pulse 82   Temp 98 F (36.7 C)   Resp 16   Ht 5' 3.47" (1.612 m)   Wt 95 lb 3.2 oz (43.2 kg)   SpO2 99%   BMI 16.62 kg/m   Body mass index is 16.62 kg/m.  GENERAL: vitals reviewed and listed above, alert, oriented, appears well hydrated and in no acute distress glasses  HEENT: atraumatic, conjunctiva  clear, no obvious abnormalities on inspection of external nose and ears OP : no lesion edema or exudate  NECK: no obvious masses on inspection palpation  LUNGS: clear to auscultation bilaterally, no wheezes, rales or rhonchi, good air movement CV: HRRR, no clubbing cyanosis or  peripheral edema nl cap refill  No g or m  Abdomen:  Sof,t normal bowel sounds without hepatosplenomegaly, no guarding rebound or masses no CVA tenderness MS: moves all extremities without noticeable focal  Abnormality External anal area shows 2 small tags without a rash redness discharge or fissure. PSYCH: pleasant and cooperative, no obvious depression or anxiety  Is less than in the past  Acne mod improved  Lab Results  Component Value Date   WBC 7.4 05/17/2013   HGB 14.9 05/17/2013   HCT 44.9 05/17/2013   PLT 273.0 05/17/2013   GLUCOSE 69 (L) 05/17/2013   CHOL 151 04/09/2006   HDL 39.9 04/09/2006   ALT 13 05/17/2013   AST 23 05/17/2013   NA 137 05/17/2013   K 3.8  05/17/2013   CL 101 05/17/2013   CREATININE 0.8 05/17/2013   BUN 8 05/17/2013   CO2 27 05/17/2013   TSH 0.99 05/17/2013   Wt Readings from Last 3 Encounters:  10/07/15 95 lb 3.2 oz (43.2 kg)  02/13/14 91 lb 12.8 oz (41.6 kg)  05/17/13 89 lb (40.4 kg)   Wt Readings from Last 3 Encounters:  10/07/15 95 lb 3.2 oz (43.2 kg)  02/13/14 91 lb 12.8 oz (41.6 kg)  05/17/13 89 lb (40.4 kg)   BP Readings from Last 3 Encounters:  10/07/15 118/70  02/13/14 90/60  05/17/13 94/60    ASSESSMENT AND PLAN:  Discussed the following assessment and plan:  Visit for preventive health examination  Panic disorder - Appears to be reasonably controlled with the Paxil continue medicine refill for a year.  Medication management  Hemorrhoidal skin tags - Discussed symptoms management no alarm findings today.  Underweight - Somewhat improved reviewed eating avoiding skipping meals and low of caffeine sugar drinks.  Decreased vision left eye - [facility]. Don't see a need for blood work today continuing having a problem with any symptomatology when eating correctly come back and we consider thyroid anemia check etc. but it is been okay in the last 2 years. Overall she seems to be doing much better since beginning working. -Patient advised to return or notify health care team  if symptoms worsen ,persist or new concerns arise.  Patient Instructions  Refilling year Paxil today continue to take daily. We'll also send in some nausea medicine if needed. Make sure you are having healthy snacks are eating. If you have caffeine and sugar without food or protein you can feel shaky and badly. Consider such things as a handful and knots yogurt a whole piece of fruit etc. If you have recurrent symptoms get back with us for reevaluation. You have some minor hemorrhoidal tags. Stay hydrated and avoid constipation if you're getting itching and irritation can use over-the-counter  Preparation H or witch hazel. Good luck with  Your jobs    can help with anxiety. Please have your eye doctor send us a copy of any evaluation may do regard to your left eye problem.  About Hemorrhoids  Hemorrhoids are swollen veins in the lower rectum and anus.  Also called piles, hemorrhoids are a common problem.  Hemorrhoids may be internal (inside the rectum) or external (around the anus).  Internal  Hemorrhoids  Internal hemorrhoids are often painless, but they rarely cause bleeding.  The internal veins may stretch and fall down (prolapse) through the anus to the outside of the body.  The veins may then become irritated and painful.  External Hemorrhoids  External hemorrhoids can be easily seen or felt around the anal opening.  They are under the skin around the anus.  When the swollen veins are scratched or broken by straining, rubbing or wiping they sometimes bleed.  How Hemorrhoids Occur  Veins in the rectum and around the anus tend to swell under pressure.  Hemorrhoids can result from increased pressure in the veins of your anus or rectum.  Some sources of pressure are:   Straining to have a bowel movement because of constipation  Waiting too long to have a bowel movement  Coughing and sneezing often  Sitting for extended periods of time, including on the toilet  Diarrhea  Obesity  Trauma or injury to the anus  Some liver diseases  Stress  Family history of hemorrhoids  Pregnancy  Pregnant women should try to avoid becoming constipated, because they are more likely to have hemorrhoids during pregnancy.  In the last trimester of pregnancy, the enlarged uterus may press on blood vessels and causes hemorrhoids.  In addition, the strain of childbirth sometimes causes hemorrhoids after the birth.  Symptoms of Hemorrhoids  Some symptoms of hemorrhoids include:  Swelling and/or a tender lump around the anus  Itching, mild burning and bleeding around the  anus  Painful bowel movements with or without constipation  Bright red blood covering the stool, on toilet paper or in the toilet bowel.   Symptoms usually go away within a few days.  Always talk to your doctor about any bleeding to make sure it is not from some other causes.  Diagnosing and Treating Hemorrhoids  Diagnosis is made by an examination by your healthcare provider.  Special test can be performed by your doctor.    Most cases of hemorrhoids can be treated with:  High-fiber diet: Eat more high-fiber foods, which help prevent constipation.  Ask for more detailed fiber information on types and sources of fiber from your healthcare provider.  Fluids: Drink plenty of water.  This helps soften bowel movements so they are easier to pass.  Sitz baths and cold packs: Sitting in lukewarm water two or three times a day for 15 minutes cleases the anal area and may relieve discomfort.  If the water is too hot, swelling around the anus will get worse.  Placing a cloth-covered ice pack on the anus for ten minutes four times a day can also help reduce selling.  Gently pushing a prolapsed hemorrhoid back inside after the bath or ice pack can be helpful.  Medications: For mild discomfort, your healthcare provider may suggest over-the-counter pain medication or prescribe a cream or ointment for topical use.  The cream may contain witch hazel, zinc oxide or petroleum jelly.  Medicated suppositories are also a treatment option.  Always consult your doctor before applying medications or creams.  Procedures and surgeries: There are also a number of procedures and surgeries to shrink or remove hemorrhoids in more serious cases.  Talk to your physician about these options.  You can often prevent hemorrhoids or keep them from becoming worse by maintaining a healthy lifestyle.  Eat a fiber-rich diet of fruits, vegetables and whole grains.  Also, drink plenty of water and exercise regularly.   2007,  [employer] Doc.30  [place] Brooking. Courtlyn Aki M.D.

## 2015-10-07 NOTE — Patient Instructions (Addendum)
Refilling year Paxil today continue to take daily. We'll also send in some nausea medicine if needed. Make sure you are having healthy snacks are eating. If you have caffeine and sugar without food or protein you can feel shaky and badly. Consider such things as a handful and knots yogurt a whole piece of fruit etc. If you have recurrent symptoms get back with [place] for reevaluation. You have some minor hemorrhoidal tags. Stay hydrated and avoid constipation if you're getting itching and irritation can use over-the-counter Preparation H or witch hazel. Good luck with  Your jobs    can help with anxiety. Please have your eye doctor send [place] a copy of any evaluation may do regard to your left eye problem.  About Hemorrhoids  Hemorrhoids are swollen veins in the lower rectum and anus.  Also called piles, hemorrhoids are a common problem.  Hemorrhoids may be internal (inside the rectum) or external (around the anus).  Internal Hemorrhoids  Internal hemorrhoids are often painless, but they rarely cause bleeding.  The internal veins may stretch and fall down (prolapse) through the anus to the outside of the body.  The veins may then become irritated and painful.  External Hemorrhoids  External hemorrhoids can be easily seen or felt around the anal opening.  They are under the skin around the anus.  When the swollen veins are scratched or broken by straining, rubbing or wiping they sometimes bleed.  How Hemorrhoids Occur  Veins in the rectum and around the anus tend to swell under pressure.  Hemorrhoids can result from increased pressure in the veins of your anus or rectum.  Some sources of pressure are:   Straining to have a bowel movement because of constipation  Waiting too long to have a bowel movement  Coughing and sneezing often  Sitting for extended periods of time, including on the toilet  Diarrhea  Obesity  Trauma or injury to the anus  Some liver diseases  Stress  Family  history of hemorrhoids  Pregnancy  Pregnant women should try to avoid becoming constipated, because they are more likely to have hemorrhoids during pregnancy.  In the last trimester of pregnancy, the enlarged uterus may press on blood vessels and causes hemorrhoids.  In addition, the strain of childbirth sometimes causes hemorrhoids after the birth.  Symptoms of Hemorrhoids  Some symptoms of hemorrhoids include:  Swelling and/or a tender lump around the anus  Itching, mild burning and bleeding around the anus  Painful bowel movements with or without constipation  Bright red blood covering the stool, on toilet paper or in the toilet bowel.   Symptoms usually go away within a few days.  Always talk to your doctor about any bleeding to make sure it is not from some other causes.  Diagnosing and Treating Hemorrhoids  Diagnosis is made by an examination by your healthcare provider.  Special test can be performed by your doctor.    Most cases of hemorrhoids can be treated with:  High-fiber diet: Eat more high-fiber foods, which help prevent constipation.  Ask for more detailed fiber information on types and sources of fiber from your healthcare provider.  Fluids: Drink plenty of water.  This helps soften bowel movements so they are easier to pass.  Sitz baths and cold packs: Sitting in lukewarm water two or three times a day for 15 minutes cleases the anal area and may relieve discomfort.  If the water is too hot, swelling around the anus will get worse.  Placing a cloth-covered ice pack on the anus for ten minutes four times a day can also help reduce selling.  Gently pushing a prolapsed hemorrhoid back inside after the bath or ice pack can be helpful.  Medications: For mild discomfort, your healthcare provider may suggest over-the-counter pain medication or prescribe a cream or ointment for topical use.  The cream may contain witch hazel, zinc oxide or petroleum jelly.  Medicated  suppositories are also a treatment option.  Always consult your doctor before applying medications or creams.  Procedures and surgeries: There are also a number of procedures and surgeries to shrink or remove hemorrhoids in more serious cases.  Talk to your physician about these options.  You can often prevent hemorrhoids or keep them from becoming worse by maintaining a healthy lifestyle.  Eat a fiber-rich diet of fruits, vegetables and whole grains.  Also, drink plenty of water and exercise regularly.   2007, [employer] Doc.30

## 2015-10-16 ENCOUNTER — Encounter (HOSPITAL_COMMUNITY): Payer: Self-pay

## 2015-10-16 ENCOUNTER — Emergency Department (HOSPITAL_COMMUNITY)
Admission: EM | Admit: 2015-10-16 | Discharge: 2015-10-16 | Disposition: A | Discharge status: Home / Self Care | Payer: Self-pay | Attending: Emergency Medicine | Admitting: Emergency Medicine

## 2015-10-16 DIAGNOSIS — W260XXA Contact with knife, initial encounter: Secondary | ICD-10-CM | POA: Insufficient documentation

## 2015-10-16 DIAGNOSIS — S61201A Unspecified open wound of left index finger without damage to nail, initial encounter: Secondary | ICD-10-CM | POA: Insufficient documentation

## 2015-10-16 DIAGNOSIS — Y990 Civilian activity done for income or pay: Secondary | ICD-10-CM | POA: Insufficient documentation

## 2015-10-16 DIAGNOSIS — S61209A Unspecified open wound of unspecified finger without damage to nail, initial encounter: Secondary | ICD-10-CM

## 2015-10-16 DIAGNOSIS — Z23 Encounter for immunization: Secondary | ICD-10-CM | POA: Insufficient documentation

## 2015-10-16 DIAGNOSIS — Y939 Activity, unspecified: Secondary | ICD-10-CM | POA: Insufficient documentation

## 2015-10-16 DIAGNOSIS — Z79899 Other long term (current) drug therapy: Secondary | ICD-10-CM | POA: Insufficient documentation

## 2015-10-16 DIAGNOSIS — E039 Hypothyroidism, unspecified: Secondary | ICD-10-CM | POA: Insufficient documentation

## 2015-10-16 DIAGNOSIS — Y929 Unspecified place or not applicable: Secondary | ICD-10-CM | POA: Insufficient documentation

## 2015-10-16 MED ORDER — TETANUS-DIPHTH-ACELL PERTUSSIS 5-2.5-18.5 LF-MCG/0.5 IM SUSP
0.5000 mL | Freq: Once | INTRAMUSCULAR | Status: AC
Start: 2015-10-16 — End: 2015-10-16
  Administered 2015-10-16: 0.5 mL via INTRAMUSCULAR
  Filled 2015-10-16: qty 0.5

## 2015-10-16 NOTE — ED Triage Notes (Signed)
PT C/O A LACERATION TO THE LEFT INDEX. PT WAS CUTTING WITH A BUTCHER KNIFE AT WORK AT THE TIME OF THE INCIDENT. NO BLEEDING AT THIS TIME.

## 2015-10-16 NOTE — Discharge Instructions (Signed)
Treatment: Wear gloves when using a lot of water during activity such as washing dishes. You can leave the wound uncovered when bathing.  Follow-up: Please return to emergency department if the wound begins to bleed uncontrollably after 15-30 minutes of pressure or if you develop any signs of infection including fever, increasing pain, redness, swelling, streaking from the area, or drainage from the wound.

## 2015-10-16 NOTE — ED Provider Notes (Signed)
[facility]-EMERGENCY DEPT Provider Note   CSN: [id number] Arrival date & time: 10/16/15  2025  By signing my name below, I, Placido Sou, attest that this documentation has been prepared under the direction and in the presence of Buel Ream, PA-C.  Electronically Signed: Placido Sou, ED Scribe. 10/16/15. 9:00 PM.    History   Chief Complaint Chief Complaint  Patient presents with  . Finger Injury    LEFT INDEX    HPI HPI Comments: Taylor Swanson is a 30 y.o. female who presents to the Emergency Department complaining of a mild laceration with controlled bleeding to her left index finger which occurred about 4 hours ago. She was using a butchers knife at work and accidentally cut the affected finger. Pt washed the wound and applied a Band-Aid and ointment to the wound PTA. She reports associated, mild, pain to the affected finger that worsens with movement. Pt denies a possibility of foreign bodies in the wound. She denies numbness, tingling, CP, SOB, abdominal pain, nausea, vomiting or other associated symptoms at this time.   The history is provided by the patient and a parent. No language interpreter was used.    Past Medical History:  Diagnosis Date  . Anxiety   . Hypothyroid   . Self mutilation   . SELF MUTILATION 03/08/2007   Qualifier: History of  By: Fabian Sharp MD, Neta Mends     Patient Active Problem List   Diagnosis Date Noted  . Anxiety disorder 02/13/2014  . Underweight 08/03/2012  . Irregular menses 12/15/2010  . Low body weight 12/15/2010  . Injury of shoulder 07/31/2010  . Joint pain in the shoulder/clavicle region 07/28/2010  . GERD 08/16/2009  . Irritable bowel syndrome 08/16/2009  . NON ADHERENCE  TO MEDICATION 07/16/2009  . HYPOTHYROIDISM 05/09/2009  . PANIC ATTACK 07/02/2008  . DISRUPTION 24 HOUR SLEEP WAKE CYCLE UNSPECIFIED 09/07/2007  . 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 ANXIETY 05/18/2006    Past Surgical History:  Procedure Laterality Date    . FRACTURE SURGERY     RIGHT ARM    OB History    No data available       Home Medications    Prior to Admission medications   Medication Sig Start Date End Date Taking? Authorizing Provider  omeprazole (PRILOSEC OTC) 20 MG tablet Take 20 mg by mouth daily.    Historical Provider, MD  ondansetron (ZOFRAN) 4 MG tablet Take 1 tablet (4 mg total) by mouth every 8 (eight) hours as needed. 10/07/15   Madelin Headings, MD  PARoxetine (PAXIL) 20 MG tablet Take 1 tablet (20 mg total) by mouth daily. 10/07/15   Madelin Headings, MD    Family History Family History  Problem Relation Age of Onset  . Thyroid disease Mother   . Acne Father     Social History Social History  Substance Use Topics  . Smoking status: Never Smoker  . Smokeless tobacco: Never Used  . Alcohol use No     Allergies   Iron   Review of Systems Review of Systems  Respiratory: Negative for shortness of breath.   Cardiovascular: Negative for chest pain.  Gastrointestinal: Negative for abdominal pain, nausea and vomiting.  Musculoskeletal: Positive for arthralgias.  Skin: Positive for wound.  Neurological: Negative for weakness and numbness.   Physical Exam Updated Vital Signs BP 106/74 (BP Location: Right Arm)   Pulse 65   Temp 98 F (36.7 C) (Oral)   Resp 18   Ht 5'  3" (1.6 m)   Wt 43.1 kg   LMP 10/08/2015   SpO2 99%   BMI 16.83 kg/m   Physical Exam  Constitutional: She appears well-developed and well-nourished. No distress.  HENT:  Head: Normocephalic and atraumatic.  Mouth/Throat: Oropharynx is clear and moist. No oropharyngeal exudate.  Eyes: Conjunctivae are normal. Pupils are equal, round, and reactive to light. Right eye exhibits no discharge. Left eye exhibits no discharge. No scleral icterus.  Neck: Normal range of motion. Neck supple. No thyromegaly present.  Cardiovascular: Normal rate, regular rhythm and normal heart sounds.  Exam reveals no gallop and no friction rub.   No murmur  heard. Pulmonary/Chest: Effort normal and breath sounds normal. No stridor. No respiratory distress. She has no wheezes. She has no rales.  Abdominal: Soft. Bowel sounds are normal. She exhibits no distension. There is no tenderness. There is no rebound and no guarding.  Musculoskeletal: She exhibits no edema.  Abduction and adduction intact in all fingers. DIP and PIP intact in all digits. Cap refill <2 seconds. Distal sensation intact.   Lymphadenopathy:    She has no cervical adenopathy.  Neurological: She is alert. Coordination normal.  Skin: Skin is warm and dry. Capillary refill takes less than 2 seconds. Laceration noted. No rash noted. She is not diaphoretic. No pallor.  5 mm laceration to the distal end palmar aspect of the left index finger. Bleeding controlled.   Psychiatric: She has a normal mood and affect.  Nursing note and vitals reviewed.  ED Treatments / Results  Labs (all labs ordered are listed, but only abnormal results are displayed) Labs Reviewed - No data to display  EKG  EKG Interpretation None       Radiology No results found.  Procedures Procedures   LACERATION REPAIR Performed by: Jinny BlossomAlexandra M Law, Cigi Bega, MD Authorized by: Emi HolesAlexandra M Law Consent: Verbal consent obtained. Risks and benefits: risks, benefits and alternatives were discussed Consent given by: patient Patient identity confirmed: provided demographic data Prepped and Draped in normal sterile fashion Wound explored  Laceration Location: L index  Laceration Length: 6mm  No Foreign Bodies seen or palpated  Irrigation method: tap Amount of cleaning: copious  Skin closure: dermabond  Number of sutures: none  Technique: dipped avulsion into dermabond to create "cap" layer  Patient tolerance: Patient tolerated the procedure well with no immediate complications.After drying and turning removed, patient hemodynamically stable. Wound no longer bleeding.   DIAGNOSTIC  STUDIES: Oxygen Saturation is 99% on RA, normal by my interpretation.    COORDINATION OF CARE: 8:59 PM Discussed next steps with pt. Pt verbalized understanding and is agreeable with the plan.    Medications Ordered in ED Medications  Tdap (BOOSTRIX) injection 0.5 mL (0.5 mLs Intramuscular Given 10/16/15 2113)     Initial Impression / Assessment and Plan / ED Course  I have reviewed the triage vital signs and the nursing notes.  Pertinent labs & imaging results that were available during my care of the patient were reviewed by me and considered in my medical decision making (see chart for details).  Clinical Course    Tetanus updated in ED. Avulsion occurred < 12 hours prior to repair. Discussed laceration care with pt and answered questions. Patient advised to return to emergency department should there be signs infection. These were discussed at length. Pt is hemodynamically stable with no complaints prior to dc.  Patient also evaluated by Dr. Jeraldine LootsLockwood who assisted with the patient's wound repair and by the patient's  management. Patient vitals stable throughout ED course and discharged in satisfactory condition.   I personally performed the services described in this documentation, which was scribed in my presence. The recorded information has been reviewed and is accurate.   Final Clinical Impressions(s) / ED Diagnoses   Final diagnoses:  Avulsion of finger tip, initial encounter    New Prescriptions Discharge Medication List as of 10/16/2015 10:33 PM       Emi Holes, PA-C 10/16/15 2256  Otherwise well young female presents with avulsion of distal fingertip. Here the patient is in no distress. Together with the physician assistant I repaired the patient's avulsion injury, using Dermabond. This was well tolerated, patient discharged in stable condition.    Gerhard Munch, MD 10/16/15 [phone]

## 2015-10-29 ENCOUNTER — Ambulatory Visit (INDEPENDENT_AMBULATORY_CARE_PROVIDER_SITE_OTHER): Payer: Self-pay | Admitting: Internal Medicine

## 2015-10-29 ENCOUNTER — Encounter: Payer: Self-pay | Admitting: Internal Medicine

## 2015-10-29 VITALS — BP 116/70 | Temp 98.2°F | Wt 94.8 lb

## 2015-10-29 DIAGNOSIS — L03114 Cellulitis of left upper limb: Secondary | ICD-10-CM

## 2015-10-29 DIAGNOSIS — L02412 Cutaneous abscess of left axilla: Secondary | ICD-10-CM

## 2015-10-29 MED ORDER — CEFTRIAXONE SODIUM 1 G IJ SOLR
1.0000 g | Freq: Once | INTRAMUSCULAR | Status: AC
  Administered 2015-10-29: 1 g via INTRAMUSCULAR

## 2015-10-29 MED ORDER — DOXYCYCLINE HYCLATE 100 MG PO TABS: 100.0000 mg | ORAL_TABLET | Freq: Two times a day (BID) | ORAL | 0 refills | Status: DC

## 2015-10-29 MED ORDER — IBUPROFEN 600 MG PO TABS: 600.0000 mg | ORAL_TABLET | Freq: Three times a day (TID) | ORAL | 0 refills | Status: DC | PRN

## 2015-10-29 NOTE — Progress Notes (Signed)
Pre visit review using our clinic review tool, if applicable. No additional management support is needed unless otherwise documented below in the visit note. 

## 2015-10-29 NOTE — Patient Instructions (Addendum)
We drained a cyst abscess in your left arm pit today. Please do warm compresses without burning your skin 5-10 minutes at least 4 times a day for the next day or 2. If it continues to drain this is a good thing. We are giving you a shot of antibiotic and will give you pills to take also beginning first thing in the am .  Make a follow-up visit for Friday morning to recheck. If not improving or getting worse we may need a surgeon  To see  You to help with drainage.     Abscess An abscess is an infected area that contains a collection of pus and debris.It can occur in almost any part of the body. An abscess is also known as a furuncle or boil. CAUSES  An abscess occurs when tissue gets infected. This can occur from blockage of oil or sweat glands, infection of hair follicles, or a minor injury to the skin. As the body tries to fight the infection, pus collects in the area and creates pressure under the skin. This pressure causes pain. People with weakened immune systems have difficulty fighting infections and get certain abscesses more often.  SYMPTOMS Usually an abscess develops on the skin and becomes a painful mass that is red, warm, and tender. If the abscess forms under the skin, you may feel a moveable soft area under the skin. Some abscesses break open (rupture) on their own, but most will continue to get worse without care. The infection can spread deeper into the body and eventually into the bloodstream, causing you to feel ill.  DIAGNOSIS  Your caregiver will take your medical history and perform a physical exam. A sample of fluid may also be taken from the abscess to determine what is causing your infection. TREATMENT  Your caregiver may prescribe antibiotic medicines to fight the infection. However, taking antibiotics alone usually does not cure an abscess. Your caregiver may need to make a small cut (incision) in the abscess to drain the pus. In some cases, gauze is packed into the  abscess to reduce pain and to continue draining the area. HOME CARE INSTRUCTIONS   Only take over-the-counter or prescription medicines for pain, discomfort, or fever as directed by your caregiver.  If you were prescribed antibiotics, take them as directed. Finish them even if you start to feel better.  If gauze is used, follow your caregiver's directions for changing the gauze.  To avoid spreading the infection:  Keep your draining abscess covered with a bandage.  Wash your hands well.  Do not share personal care items, towels, or whirlpools with others.  Avoid skin contact with others.  Keep your skin and clothes clean around the abscess.  Keep all follow-up appointments as directed by your caregiver. SEEK MEDICAL CARE IF:   You have increased pain, swelling, redness, fluid drainage, or bleeding.  You have muscle aches, chills, or a general ill feeling.  You have a fever. MAKE SURE YOU:   Understand these instructions.  Will watch your condition.  Will get help right away if you are not doing well or get worse.   This information is not intended to replace advice given to you by your health care provider. Make sure you discuss any questions you have with your health care provider.   Document Released: 10/01/2004 Document Revised: 06/23/2011 Document Reviewed: 03/06/2011 Elsevier Interactive Patient Education [employer]2016 Elsevier Inc.

## 2015-10-29 NOTE — Progress Notes (Signed)
Chief Complaint  Patient presents with  . Abcess    Left arm for 2-3 days.  Also has nasal congestion.    HPI: Taylor Swanson 30 y.o.  comes in today because of swelling and pain in her left axilla. She has a small bump there but it started bothering her and she squeezed it and then it became larger. And redness and pain down her arm. No fever or chills. She comes in today to be evaluated. As always had a bump intermittently but not a problem does shave over it. When they squeezed it and got a small amount of discharge. ROS: See pertinent positives and negatives per HPI.  Past Medical History:  Diagnosis Date  . Anxiety   . Hypothyroid   . Self mutilation   . SELF MUTILATION 03/08/2007   Qualifier: History of  By: Fabian Sharp MD, Neta Mends     Family History  Problem Relation Age of Onset  . Thyroid disease Mother   . Acne Father     Social History   Social History  . Marital status: Single    Spouse name: N/A  . Number of children: N/A  . Years of education: N/A   Social History Main Topics  . Smoking status: Never Smoker  . Smokeless tobacco: Never Used  . Alcohol use No  . Drug use: No  . Sexual activity: No   Other Topics Concern  . None   Social History Narrative   Single   Regular exercise- yes sometimes    ? Tad at home finishing hs on line [employer] to get hs diploma         Lives in her household with 9 cats and dogs likes animals takes care of them.    trying to get her driver's license.      Neg tad     Outpatient Medications Prior to Visit  Medication Sig Dispense Refill  . PARoxetine (PAXIL) 20 MG tablet Take 1 tablet (20 mg total) by mouth daily. 90 tablet 3  . ondansetron (ZOFRAN) 4 MG tablet Take 1 tablet (4 mg total) by mouth every 8 (eight) hours as needed. (Patient not taking: Reported on 10/29/2015) 20 tablet 1  . omeprazole (PRILOSEC OTC) 20 MG tablet Take 20 mg by mouth daily.     No facility-administered medications prior to  visit.      EXAM:  BP 116/70 (BP Location: Right Arm, Patient Position: Sitting, Cuff Size: Normal)   Temp 98.2 F (36.8 C) (Oral)   Wt 94 lb 12.8 oz (43 kg)   LMP 10/08/2015   BMI 16.79 kg/m   Body mass index is 16.79 kg/m.  GENERAL: vitals reviewed and listed above, alert, oriented, appears well hydrated and in no acute distress HEENT: atraumatic, conjunctiva  clear, no obvious abnormalities on inspection of external nose and ears Mild nasal congestion. OP : no lesion edema or exudate  NECK: no obvious masses on inspection palpation  Left axilla with 2-1/2-3 cm ovoid cystic abscess without a central point. There is diffuse redness tracking down the left arm to 3 cm above the elbow. There is no mass effect and good range of motion of her joints. MS: moves all extremities without noticeable focal  abnormality PSYCH: pleasant and cooperative, no obvious depression or anxiety Discussed procedure options 2% with epi 1 mL #11 blade cystic plus purulent discharge released and expressed. Culture. No packing placed. Culture sent  ASSESSMENT AND PLAN:  Discussed the following assessment  and plan:  Abscess of axilla, left - Plan: WOUND CULTURE, cefTRIAXone (ROCEPHIN) injection 1 g  Cellulitis of left upper extremity Abscess cyst with cellulitis based on exam. Rocephin IM and oral antibiotics warm compresses and follow-up on Friday. Consideration of further evaluation if needed. Expectant management. Ibuprofen sent in for pain. -Patient advised to return or notify health care team  if symptoms worsen ,persist or new concerns arise.  Patient Instructions  We drained a cyst abscess in your left arm pit today. Please do warm compresses without burning your skin 5-10 minutes at least 4 times a day for the next day or 2. If it continues to drain this is a good thing. We are giving you a shot of antibiotic and will give you pills to take also beginning first thing in the am .  Make a follow-up  visit for Friday morning to recheck. If not improving or getting worse we may need a surgeon  To see  You to help with drainage.     Abscess An abscess is an infected area that contains a collection of pus and debris.It can occur in almost any part of the body. An abscess is also known as a furuncle or boil. CAUSES  An abscess occurs when tissue gets infected. This can occur from blockage of oil or sweat glands, infection of hair follicles, or a minor injury to the skin. As the body tries to fight the infection, pus collects in the area and creates pressure under the skin. This pressure causes pain. People with weakened immune systems have difficulty fighting infections and get certain abscesses more often.  SYMPTOMS Usually an abscess develops on the skin and becomes a painful mass that is red, warm, and tender. If the abscess forms under the skin, you may feel a moveable soft area under the skin. Some abscesses break open (rupture) on their own, but most will continue to get worse without care. The infection can spread deeper into the body and eventually into the bloodstream, causing you to feel ill.  DIAGNOSIS  Your caregiver will take your medical history and perform a physical exam. A sample of fluid may also be taken from the abscess to determine what is causing your infection. TREATMENT  Your caregiver may prescribe antibiotic medicines to fight the infection. However, taking antibiotics alone usually does not cure an abscess. Your caregiver may need to make a small cut (incision) in the abscess to drain the pus. In some cases, gauze is packed into the abscess to reduce pain and to continue draining the area. HOME CARE INSTRUCTIONS   Only take over-the-counter or prescription medicines for pain, discomfort, or fever as directed by your caregiver.  If you were prescribed antibiotics, take them as directed. Finish them even if you start to feel better.  If gauze is used, follow your  caregiver's directions for changing the gauze.  To avoid spreading the infection:  Keep your draining abscess covered with a bandage.  Wash your hands well.  Do not share personal care items, towels, or whirlpools with others.  Avoid skin contact with others.  Keep your skin and clothes clean around the abscess.  Keep all follow-up appointments as directed by your caregiver. SEEK MEDICAL CARE IF:   You have increased pain, swelling, redness, fluid drainage, or bleeding.  You have muscle aches, chills, or a general ill feeling.  You have a fever. MAKE SURE YOU:   Understand these instructions.  Will watch your condition.  Will get  help right away if you are not doing well or get worse.   This information is not intended to replace advice given to you by your health care provider. Make sure you discuss any questions you have with your health care provider.   Document Released: 10/01/2004 Document Revised: 06/23/2011 Document Reviewed: 03/06/2011 Elsevier Interactive Patient Education 2016 [employer]     MikesWanda K. Treyshaun Keatts M.D.

## 2015-11-01 ENCOUNTER — Ambulatory Visit (INDEPENDENT_AMBULATORY_CARE_PROVIDER_SITE_OTHER): Payer: Self-pay | Admitting: Internal Medicine

## 2015-11-01 ENCOUNTER — Encounter: Payer: Self-pay | Admitting: Internal Medicine

## 2015-11-01 VITALS — BP 114/74 | Temp 97.9°F | Wt 97.6 lb

## 2015-11-01 DIAGNOSIS — L02412 Cutaneous abscess of left axilla: Secondary | ICD-10-CM

## 2015-11-01 DIAGNOSIS — L089 Local infection of the skin and subcutaneous tissue, unspecified: Secondary | ICD-10-CM

## 2015-11-01 DIAGNOSIS — L729 Follicular cyst of the skin and subcutaneous tissue, unspecified: Secondary | ICD-10-CM

## 2015-11-01 LAB — WOUND CULTURE
GRAM STAIN: NONE SEEN
Gram Stain: NONE SEEN
Organism ID, Bacteria: NO GROWTH

## 2015-11-01 NOTE — Progress Notes (Signed)
Pre visit review using our clinic review tool, if applicable. No additional management support is needed unless otherwise documented below in the visit note. 

## 2015-11-01 NOTE — Progress Notes (Signed)
Chief Complaint  Patient presents with  . Follow-up    HPI: Taylor Swanson 30 y.o. taking antibiotics stopped draining pain is down but there still a lump. Some lumpiness in the armpit also. No fever. ROS: See pertinent positives and negatives per HPI.  Past Medical History:  Diagnosis Date  . Anxiety   . Hypothyroid   . Self mutilation   . SELF MUTILATION 03/08/2007   Qualifier: History of  By: Fabian Sharp MD, Neta Mends     Family History  Problem Relation Age of Onset  . Thyroid disease Mother   . Acne Father     Social History   Social History  . Marital status: Single    Spouse name: N/A  . Number of children: N/A  . Years of education: N/A   Social History Main Topics  . Smoking status: Never Smoker  . Smokeless tobacco: Never Used  . Alcohol use No  . Drug use: No  . Sexual activity: No   Other Topics Concern  . None   Social History Narrative   Single   Regular exercise- yes sometimes    ? Tad at home finishing hs on line [employer] to get hs diploma         Lives in her household with 9 cats and dogs likes animals takes care of them.    trying to get her driver's license.      Neg tad     Outpatient Medications Prior to Visit  Medication Sig Dispense Refill  . doxycycline (VIBRA-TABS) 100 MG tablet Take 1 tablet (100 mg total) by mouth 2 (two) times daily. 14 tablet 0  . PARoxetine (PAXIL) 20 MG tablet Take 1 tablet (20 mg total) by mouth daily. 90 tablet 3  . ibuprofen (ADVIL,MOTRIN) 600 MG tablet Take 1 tablet (600 mg total) by mouth every 8 (eight) hours as needed for moderate pain. (Patient not taking: Reported on 11/01/2015) 30 tablet 0  . ondansetron (ZOFRAN) 4 MG tablet Take 1 tablet (4 mg total) by mouth every 8 (eight) hours as needed. (Patient not taking: Reported on 11/01/2015) 20 tablet 1   No facility-administered medications prior to visit.      EXAM:  BP 114/74 (BP Location: Right Arm, Patient Position: Sitting, Cuff  Size: Normal)   Temp 97.9 F (36.6 C) (Oral)   Wt 97 lb 9.6 oz (44.3 kg)   LMP 10/08/2015   BMI 17.29 kg/m   Body mass index is 17.29 kg/m.  GENERAL: vitals reviewed and listed above, alert, oriented, appears well hydrated and in no acute distress HEENT: atraumatic, conjunctiva  clear, no obvious abnormalities on inspection of external nose and ears  List axilla with 1.5 cm nodular lesion that is no longer tender minimal redness arm without erythema axillary lumpiness but no fluctuance and not a lot of tenderness. Culture showed many white cells but no growth.   ASSESSMENT AND PLAN:  Discussed the following assessment and plan:  Abscess of axilla, left  Infected cyst of skin Significantly improved so far finish antibiotic if recurrent relapsing not improved consider surgical consult. -Patient advised to return or notify health care team  if symptoms worsen ,persist or new concerns arise.  Patient Instructions  The area looks a lot better but he still have the cyst cavity there. The lumpiness in your armpit may be secondary lymph nodes trying to fight infection. Continue warm compresses and antibiotic treatment until done. If relapsing persistent pain get back with [place]  considier surgical consultation .     Neta MendsWanda K. Celesta Funderburk M.D.

## 2015-11-01 NOTE — Patient Instructions (Signed)
The area looks a lot better but he still have the cyst cavity there. The lumpiness in your armpit may be secondary lymph nodes trying to fight infection. Continue warm compresses and antibiotic treatment until done. If relapsing persistent pain get back with us   considier surgical consultation .

## 2016-05-09 ENCOUNTER — Emergency Department (HOSPITAL_BASED_OUTPATIENT_CLINIC_OR_DEPARTMENT_OTHER): Payer: Worker's Compensation

## 2016-05-09 ENCOUNTER — Emergency Department (HOSPITAL_BASED_OUTPATIENT_CLINIC_OR_DEPARTMENT_OTHER)
Admission: EM | Admit: 2016-05-09 | Discharge: 2016-05-09 | Disposition: A | Discharge status: Home / Self Care | Payer: Worker's Compensation | Attending: Emergency Medicine | Admitting: Emergency Medicine

## 2016-05-09 ENCOUNTER — Encounter (HOSPITAL_BASED_OUTPATIENT_CLINIC_OR_DEPARTMENT_OTHER): Payer: Self-pay | Admitting: Emergency Medicine

## 2016-05-09 DIAGNOSIS — Y9389 Activity, other specified: Secondary | ICD-10-CM | POA: Diagnosis not present

## 2016-05-09 DIAGNOSIS — Z791 Long term (current) use of non-steroidal anti-inflammatories (NSAID): Secondary | ICD-10-CM | POA: Insufficient documentation

## 2016-05-09 DIAGNOSIS — E039 Hypothyroidism, unspecified: Secondary | ICD-10-CM | POA: Insufficient documentation

## 2016-05-09 DIAGNOSIS — S9031XA Contusion of right foot, initial encounter: Secondary | ICD-10-CM | POA: Diagnosis not present

## 2016-05-09 DIAGNOSIS — W1789XA Other fall from one level to another, initial encounter: Secondary | ICD-10-CM | POA: Diagnosis not present

## 2016-05-09 DIAGNOSIS — Y999 Unspecified external cause status: Secondary | ICD-10-CM | POA: Diagnosis not present

## 2016-05-09 DIAGNOSIS — Y929 Unspecified place or not applicable: Secondary | ICD-10-CM | POA: Diagnosis not present

## 2016-05-09 DIAGNOSIS — M79671 Pain in right foot: Secondary | ICD-10-CM

## 2016-05-09 DIAGNOSIS — S99921A Unspecified injury of right foot, initial encounter: Secondary | ICD-10-CM | POA: Diagnosis present

## 2016-05-09 NOTE — ED Notes (Signed)
Pt given d/c instructions as per chart. Verbalizes understanding. No questions. 

## 2016-05-09 NOTE — Discharge Instructions (Signed)
Please read instructions below. You can take Advil or Tylenol as needed for pain and swelling. Apply ice to your foot for 20 minutes at a time, keep it elevated as much as possible.  Schedule an appointment to follow up with orthopedics if symptoms persist. Return to the ER if numbness has not improved, or for new or worsening symptoms.

## 2016-05-09 NOTE — ED Provider Notes (Signed)
[facility]   CSN: [id number] Arrival date & time: 05/09/16  1120     History   Chief Complaint Chief Complaint  Patient presents with  . Foot Pain    HPI Taylor Swanson is a 31 y.o. female.  Patient reports with right foot injury that occurred on Thursday as she was jumping down onto a cabinet from a height of about 3 feet, landed on her right foot causing immediate pain. Localizes pain to right lateral foot and forefoot, some relief with Advil. Pain is minimal at rest, she is able to bear weight on her heel, however with extreme sharp pain with weight on ball of foot. States right foot is swollen, with bruising along the right lateral aspect. Denies previous injury to right foot or ankle. Reports mild numbness on the right lateral foot. Denies head trauma, LOC, any other injuries.      Past Medical History:  Diagnosis Date  . Anxiety   . Hypothyroid   . Self mutilation   . SELF MUTILATION 03/08/2007   Qualifier: History of  By: Fabian Sharp MD, Neta Mends     Patient Active Problem List   Diagnosis Date Noted  . Anxiety disorder 02/13/2014  . Underweight 08/03/2012  . Irregular menses 12/15/2010  . Low body weight 12/15/2010  . Injury of shoulder 07/31/2010  . Joint pain in the shoulder/clavicle region 07/28/2010  . GERD 08/16/2009  . Irritable bowel syndrome 08/16/2009  . NON ADHERENCE  TO MEDICATION 07/16/2009  . HYPOTHYROIDISM 05/09/2009  . PANIC ATTACK 07/02/2008  . DISRUPTION 24 HOUR SLEEP WAKE CYCLE UNSPECIFIED 09/07/2007  . 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 ANXIETY 05/18/2006    Past Surgical History:  Procedure Laterality Date  . FRACTURE SURGERY     RIGHT ARM    OB History    No data available       Home Medications    Prior to Admission medications   Medication Sig Start Date End Date Taking? Authorizing Provider  doxycycline (VIBRA-TABS) 100 MG tablet Take 1 tablet (100 mg total) by mouth 2 (two) times daily. 10/29/15    Panosh, Neta Mends, MD  ibuprofen (ADVIL,MOTRIN) 600 MG tablet Take 1 tablet (600 mg total) by mouth every 8 (eight) hours as needed for moderate pain. Patient not taking: Reported on 11/01/2015 10/29/15   Panosh, Neta Mends, MD  ondansetron (ZOFRAN) 4 MG tablet Take 1 tablet (4 mg total) by mouth every 8 (eight) hours as needed. Patient not taking: Reported on 11/01/2015 10/07/15   Panosh, Neta Mends, MD  PARoxetine (PAXIL) 20 MG tablet Take 1 tablet (20 mg total) by mouth daily. 10/07/15   Panosh, Neta Mends, MD    Family History Family History  Problem Relation Age of Onset  . Thyroid disease Mother   . Acne Father     Social History Social History  Substance Use Topics  . Smoking status: Never Smoker  . Smokeless tobacco: Never Used  . Alcohol use Yes     Comment: occ     Allergies   Iron   Review of Systems Review of Systems  Musculoskeletal: Positive for arthralgias (Right foot), joint swelling (Right foot) and myalgias.  Skin: Positive for color change (Bruising right foot).  Neurological: Positive for numbness.     Physical Exam Updated Vital Signs BP 118/76 (BP Location: Right Arm)   Pulse 66   Temp 97.9 F (36.6 C) (Oral)   Resp 16   Ht 5\' 4"  (1.626  m)   Wt 43.1 kg   LMP 05/01/2016   SpO2 100%   BMI 16.31 kg/m   Physical Exam  Constitutional: She appears well-developed and well-nourished.  HENT:  Head: Normocephalic and atraumatic.  Eyes: Conjunctivae are normal.  Cardiovascular: Normal rate and intact distal pulses.   Pulmonary/Chest: Effort normal.  Musculoskeletal:  Right foot with ecchymosis and edema along the lateral aspect and dorsum of foot. TTP right lateral aspect and over metatarsals. No lateral malleolus or medial malleolus tenderness. Ankle with normal range of motion, limited ROM of forefoot 2/t pain, no deformities. Dorsalis pedis pulse intact.  Psychiatric: She has a normal mood and affect. Her behavior is normal.  Nursing note and vitals  reviewed.    ED Treatments / Results  Labs (all labs ordered are listed, but only abnormal results are displayed) Labs Reviewed - No data to display  EKG  EKG Interpretation None       Radiology Dg Ankle Complete Right  Result Date: 05/09/2016 CLINICAL DATA:  Right foot pain after injury 2 days ago. EXAM: RIGHT ANKLE - COMPLETE 3+ VIEW COMPARISON:  None. FINDINGS: There is no evidence of fracture, dislocation, or joint effusion. There is no evidence of arthropathy or other focal bone abnormality. Soft tissues are unremarkable. IMPRESSION: Normal right ankle. Electronically Signed   By: Lupita RaiderJames  Green Jr, M.D.   On: 05/09/2016 13:39   Dg Foot Complete Right  Result Date: 05/09/2016 CLINICAL DATA:  Right foot pain. Recently jumped off ledge. Bruising to the lateral portion of foot. Pain with ambulation. EXAM: RIGHT FOOT COMPLETE - 3+ VIEW COMPARISON:  05/09/2016 FINDINGS: There is no evidence of fracture or dislocation. There is no evidence of arthropathy or other focal bone abnormality. Mild diffuse soft tissue swelling. IMPRESSION: 1. No acute bone abnormality. 2. Soft tissue swelling. Electronically Signed   By: Signa Kellaylor  Stroud M.D.   On: 05/09/2016 13:38    Procedures Procedures (including critical care time)  Medications Ordered in ED Medications - No data to display   Initial Impression / Assessment and Plan / ED Course  I have reviewed the triage vital signs and the nursing notes.  Pertinent labs & imaging results that were available during my care of the patient were reviewed by me and considered in my medical decision making (see chart for details).     Pt w R foot pain. Xray without acute fracture. Pulses intact. Symptoms of numbness likely 2/t edema. Ace wrap applied for compression, crutches for support. Symptomatic tx including tylenol/advil and RICE. Ortho referral given for follow up. Return to ER if numbness doesn't improve. Pt safe for discharge home.  Patient  discussed with Dr. Ranae PalmsYelverton, who agrees with care plan.  Discussed results, findings, treatment and follow up. Patient advised of return precautions. Patient verbalized understanding and agreed with plan.   Final Clinical Impressions(s) / ED Diagnoses   Final diagnoses:  Foot pain, right    New Prescriptions Discharge Medication List as of 05/09/2016  2:57 PM       Russo, [place] N, PA-C 05/09/16 1619    Loren RacerYelverton, David, MD 05/18/16 1432

## 2016-05-09 NOTE — ED Triage Notes (Signed)
Pt c/o RT foot pain since Thurs after jumping off a ledge onto a file cabinet (3 ft drop)

## 2017-01-09 ENCOUNTER — Telehealth: Payer: Self-pay | Admitting: Internal Medicine

## 2017-01-12 NOTE — Telephone Encounter (Signed)
Lm for patient.  

## 2017-01-12 NOTE — Telephone Encounter (Addendum)
Last refilled 10/07/15, #90 x 3 rf Last seen 10/2015 Needs OV for refills.  Year supply sent in error to the pharmacy - this has been cancelled and will be resent once patient schedules OV.

## 2017-01-14 MED ORDER — PAROXETINE HCL 20 MG PO TABS: 20.0000 mg | ORAL_TABLET | Freq: Every day | ORAL | 0 refills | Status: DC

## 2017-01-14 NOTE — Telephone Encounter (Signed)
1 month supply sent to pharmacy. Pt aware via voicemail. Nothing further needed.

## 2017-01-14 NOTE — Telephone Encounter (Signed)
Pt has made an appt for 1/16, but states she only has 1 pill left.  Can you send enough to get her through to appt?  PARoxetine (PAXIL) 20 MG tablet  [facility] [address] - [place] ([place]), [place] - [address] [phone] (Phone) [phone] (Fax)

## 2017-01-19 NOTE — Progress Notes (Signed)
Chief Complaint  Patient presents with  . Medication Management    Paxil    HPI: Taylor Swanson 32 y.o. come in for Cmed  management   Not seen for since 10 17    bow has insurance   She has been on antianxiety medicine for many years Paxil 20 mg seems to help with the most.  She had a short-term 30-day refill and will need continuing therapy refills.  She is now living on her own with a full-time job has 16 cats that she takes care of.  Works day hours.  She does eat breakfast sometimes misses lunch because she is busy but tries to eat no major restrictions her stomach bothers her when she is anxious but she has been doing okay.  Has fairly regular periods sees the eye doctor.  Tries to not eat fatty foods to control her acne.  See past she has been on medications.   ROS: See pertinent positives and negatives per HPI.  No chest pain shortness of breath blood in stool vomiting. Past Medical History:  Diagnosis Date  . Anxiety   . Hypothyroid   . Self mutilation   . SELF MUTILATION 03/08/2007   Qualifier: History of  By: Fabian SharpPanosh MD, Neta MendsWanda K     Family History  Problem Relation Age of Onset  . Thyroid disease Mother   . Acne Father     Social History   Socioeconomic History  . Marital status: Single    Spouse name: None  . Number of children: None  . Years of education: None  . Highest education level: None  Social Needs  . Financial resource strain: None  . Food insecurity - worry: None  . Food insecurity - inability: None  . Transportation needs - medical: None  . Transportation needs - non-medical: None  Occupational History  . None  Tobacco Use  . Smoking status: Never Smoker  . Smokeless tobacco: Never Used  Substance and Sexual Activity  . Alcohol use: Yes    Comment: occ  . Drug use: No  . Sexual activity: No    Birth control/protection: None  Other Topics Concern  . None  Social History Narrative   Single   Regular exercise- yes sometimes    ? Tad  at home finishing hs on line [employer] to get hs diploma         Lives in her household with 9 cats and dogs likes animals takes care of them.    trying to get her driver's license.      Neg tad     Outpatient Medications Prior to Visit  Medication Sig Dispense Refill  . ibuprofen (ADVIL,MOTRIN) 600 MG tablet Take 1 tablet (600 mg total) by mouth every 8 (eight) hours as needed for moderate pain. 30 tablet 0  . ondansetron (ZOFRAN) 4 MG tablet Take 1 tablet (4 mg total) by mouth every 8 (eight) hours as needed. 20 tablet 1  . PARoxetine (PAXIL) 20 MG tablet Take 1 tablet (20 mg total) by mouth daily. 30 tablet 0  . doxycycline (VIBRA-TABS) 100 MG tablet Take 1 tablet (100 mg total) by mouth 2 (two) times daily. 14 tablet 0   No facility-administered medications prior to visit.      EXAM:  BP (!) 108/58 (BP Location: Right Arm, Patient Position: Sitting, Cuff Size: Normal)   Pulse 72   Temp 98 F (36.7 C) (Oral)   Wt 94 lb 12.8 oz (43 kg)  BMI 16.27 kg/m   Body mass index is 16.27 kg/m.  GENERAL: vitals reviewed and listed above, alert, oriented, appears well hydrated and in no acute distress mild acne face very slender.  But appears well. HEENT: atraumatic, conjunctiva  clear, no obvious abnormalities on inspection of external nose and ears OP : no lesion edema or exudate  NECK: no obvious masses on inspection palpation  LUNGS: clear to auscultation bilaterally, no wheezes, rales or rhonchi, good air movement CV: HRRR, no clubbing cyanosis or  peripheral edema nl cap refill  Abdomen soft without obvious organomegaly or tenderness. MS: moves all extremities without noticeable focal  abnormality PSYCH: pleasant and cooperative, no obvious depression or anxiety Lab Results  Component Value Date   WBC 7.4 05/17/2013   HGB 14.9 05/17/2013   HCT 44.9 05/17/2013   PLT 273.0 05/17/2013   GLUCOSE 69 (L) 05/17/2013   CHOL 151 04/09/2006   HDL 39.9 04/09/2006   ALT  13 05/17/2013   AST 23 05/17/2013   NA 137 05/17/2013   K 3.8 05/17/2013   CL 101 05/17/2013   CREATININE 0.8 05/17/2013   BUN 8 05/17/2013   CO2 27 05/17/2013   TSH 0.99 05/17/2013   BP Readings from Last 3 Encounters:  01/20/17 (!) 108/58  05/09/16 118/76  11/01/15 114/74    ASSESSMENT AND PLAN:  Discussed the following assessment and plan:  Panic disorder - Plan: Comprehensive metabolic panel, TSH, CBC with Differential/Platelet, Thyroid antibodies, T4, free, Lipid panel  Medication management - Plan: Comprehensive metabolic panel, TSH, CBC with Differential/Platelet, Thyroid antibodies, T4, free, Lipid panel  Underweight - Plan: Comprehensive metabolic panel, TSH, CBC with Differential/Platelet, Thyroid antibodies, T4, free, Lipid panel, Amb ref to Medical Nutrition Therapy-MNT  Abnormal thyroid function test - Plan: Comprehensive metabolic panel, TSH, CBC with Differential/Platelet, Thyroid antibodies, T4, free, Lipid panel, Amb ref to Medical Nutrition Therapy-MNT She had a history of abnormal thyroid test in the past will repeat these with antibody studies.  Orders placed at the [facility] laboratory so she will have options and flexibility when to get the blood drawn.  We discussed fasting blood work versus nonfasting. Depending on results we will plan follow-up but otherwise in 6 months we will check her weight then. We discussed nutrition consult unsure if this is covered with her diagnosis but she would truly benefit from this.  Discussed adding fresh fruit or yogurt snack in the middle of the day especially if she cannot get her lunch. Refilled her Paxil at this time. She has 0 to no risk but can follow-up with the previous GYN to get a Pap smear if wishes. -Patient advised to return or notify health care team  if  new concerns arise.  Patient Instructions   Fasting lab  At [facility]  When you can  I will put in orders.  You do not need an appt  Advise take fruit  ir a snack   If all ok then   6 mos with med and weight check .  May want to get  Pap gyne check at  The previous gyne office.   Can have water   Black coffee.   And medicines   We will check blood sugar and anemia check and thyroid check .   Wt Readings from Last 3 Encounters:  01/20/17 94 lb 12.8 oz (43 kg)  05/09/16 95 lb (43.1 kg)  11/01/15 97 lb 9.6 oz (44.3 kg)   Consider nutritionist.  Id can do referral .        Neta Mends. Davone Shinault M.D.

## 2017-01-20 ENCOUNTER — Encounter: Payer: Self-pay | Admitting: Internal Medicine

## 2017-01-20 ENCOUNTER — Ambulatory Visit (INDEPENDENT_AMBULATORY_CARE_PROVIDER_SITE_OTHER): Payer: Self-pay | Admitting: Internal Medicine

## 2017-01-20 VITALS — BP 108/58 | HR 72 | Temp 98.0°F | Wt 94.8 lb

## 2017-01-20 DIAGNOSIS — F410 Panic disorder [episodic paroxysmal anxiety] without agoraphobia: Secondary | ICD-10-CM

## 2017-01-20 DIAGNOSIS — Z79899 Other long term (current) drug therapy: Secondary | ICD-10-CM

## 2017-01-20 DIAGNOSIS — R636 Underweight: Secondary | ICD-10-CM

## 2017-01-20 DIAGNOSIS — R946 Abnormal results of thyroid function studies: Secondary | ICD-10-CM

## 2017-01-20 MED ORDER — PAROXETINE HCL 20 MG PO TABS: 20.0000 mg | ORAL_TABLET | Freq: Every day | ORAL | 3 refills | Status: DC

## 2017-01-20 NOTE — Patient Instructions (Addendum)
Fasting lab  At [facility]  When you can  I will put in orders.  You do not need an appt  Advise take fruit ir a snack   If all ok then   6 mos with med and weight check .  May want to get  Pap gyne check at  The previous gyne office.   Can have water   Black coffee.   And medicines   We will check blood sugar and anemia check and thyroid check .   Wt Readings from Last 3 Encounters:  01/20/17 94 lb 12.8 oz (43 kg)  05/09/16 95 lb (43.1 kg)  11/01/15 97 lb 9.6 oz (44.3 kg)   Consider nutritionist.     Id can do referral .

## 2018-01-16 IMAGING — CR DG FOOT COMPLETE 3+V*R*
3 series · 3 of 3 positions shown · non-contrast
Comparison: 05/09/2016

CLINICAL DATA: Right foot pain. Recently jumped off ledge. Bruising
to the lateral portion of foot. Pain with ambulation.

EXAM:
RIGHT FOOT COMPLETE - 3+ VIEW

[t foot ap right]
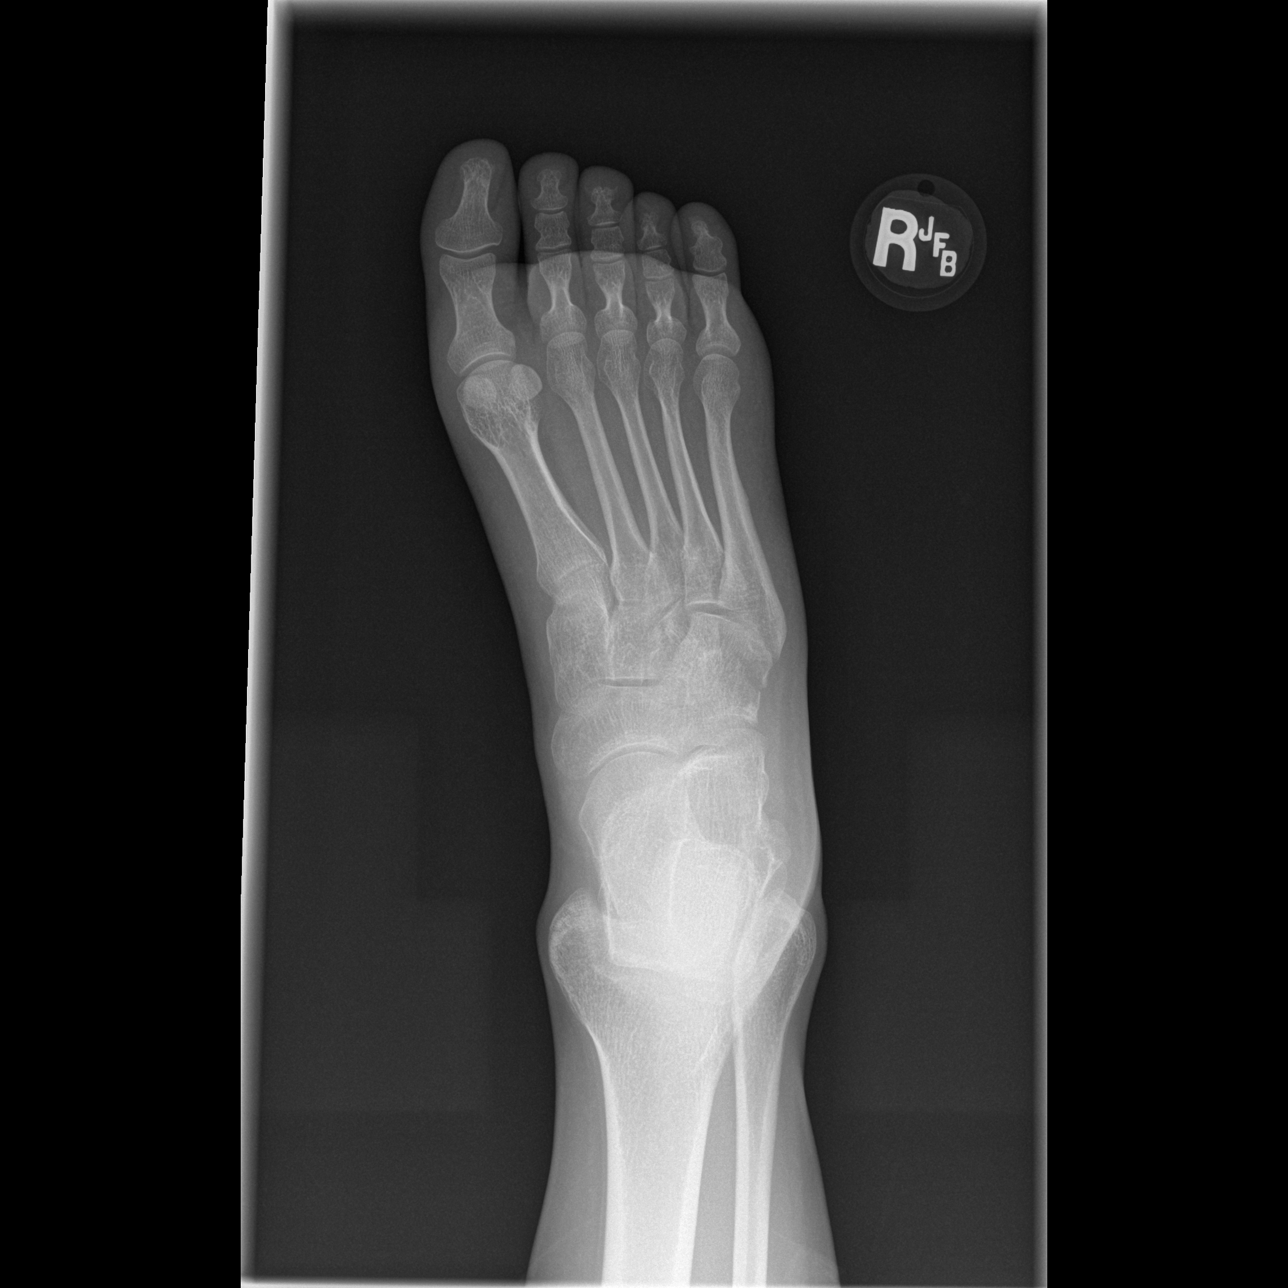

[t foot oblique right]
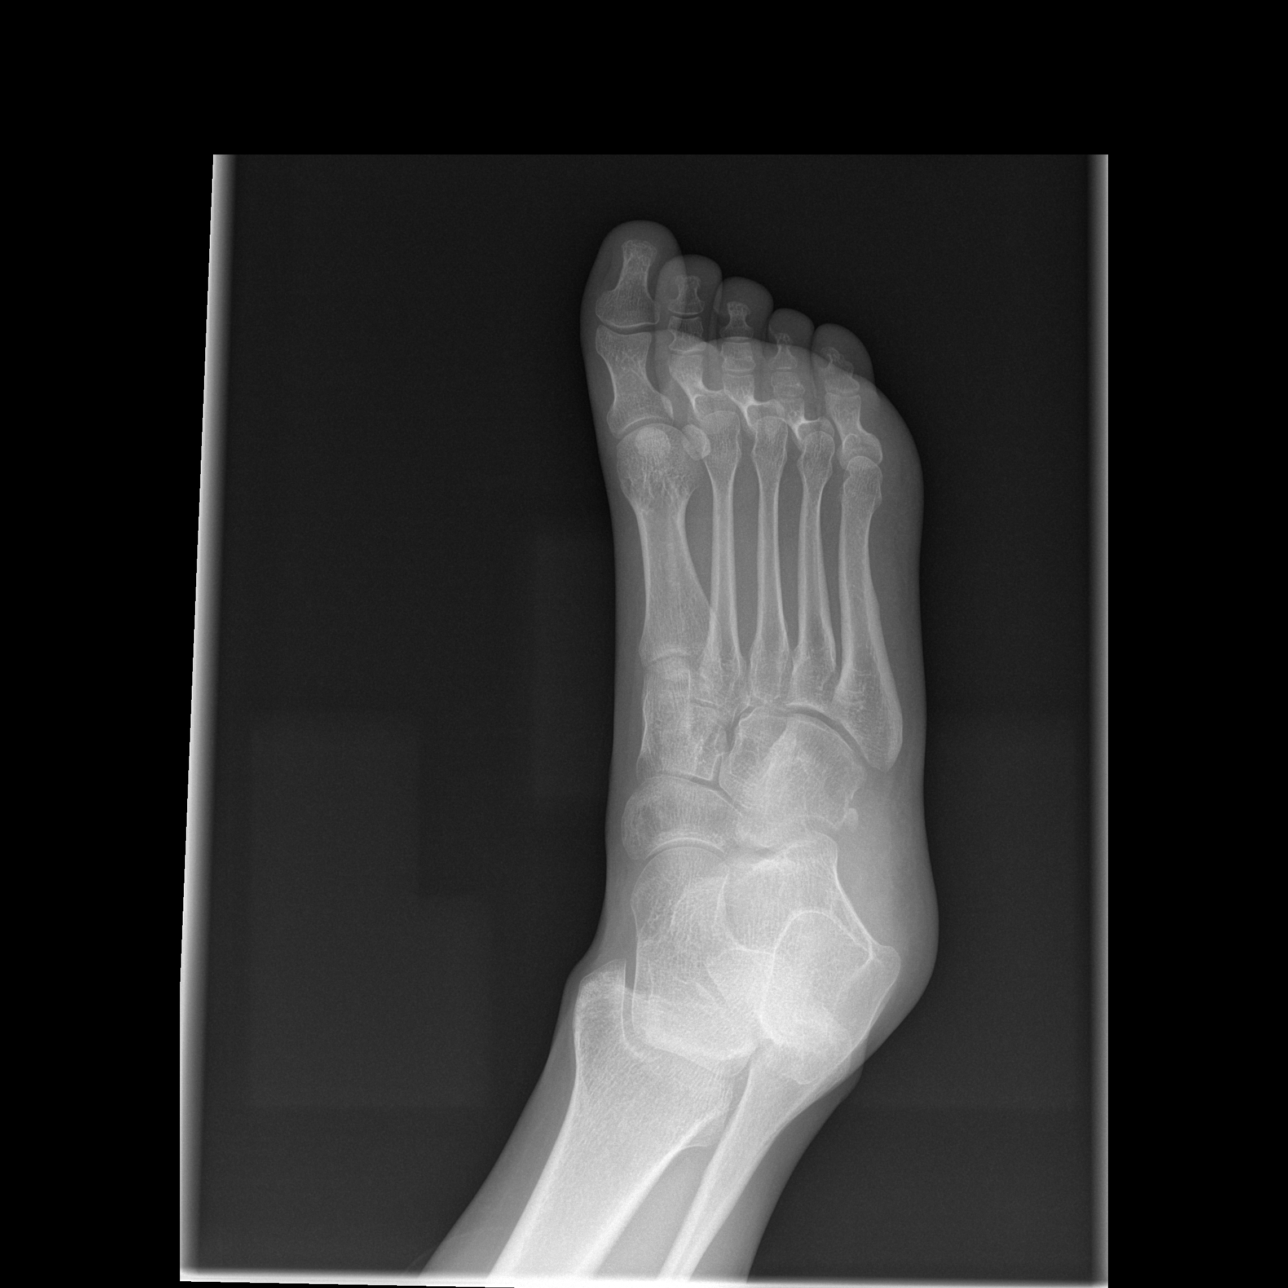

[t foot lat right]
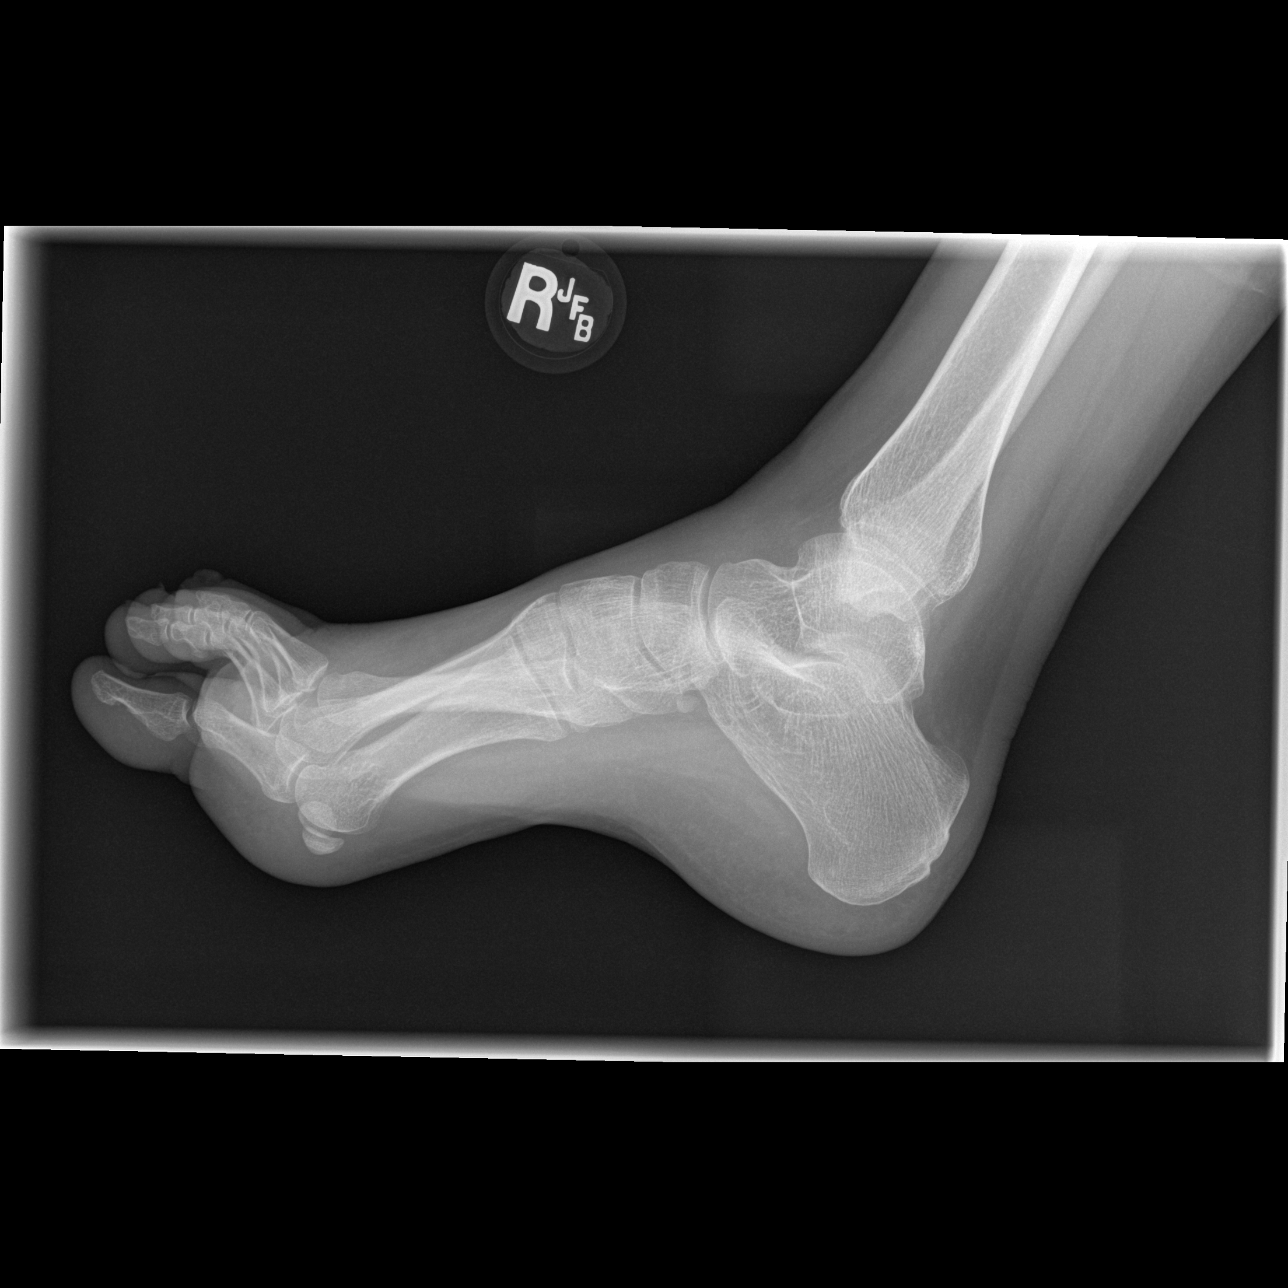

[3 of 3 positions shown; findings below may reference images not displayed]

FINDINGS: There is no evidence of fracture or dislocation. There is no
evidence of arthropathy or other focal bone abnormality. Mild
diffuse soft tissue swelling.
IMPRESSION: 1. No acute bone abnormality.
2. Soft tissue swelling.

## 2018-03-08 ENCOUNTER — Other Ambulatory Visit: Payer: Self-pay | Admitting: Internal Medicine

## 2018-03-08 NOTE — Telephone Encounter (Signed)
Patient returned call and informed patient of PCP recommendation, patient declined scheduling appointment stating she has no insurance, please advise

## 2018-03-08 NOTE — Telephone Encounter (Signed)
Last filled:01/20/17 Last OV: 01/20/17 Upcoming appt:none at this time

## 2018-03-08 NOTE — Telephone Encounter (Signed)
Get her  Yearly  office visit  Then refill enough for  one month

## 2018-03-08 NOTE — Telephone Encounter (Signed)
lvm for pt to call back okay for nurse triage to disclose  

## 2018-03-09 NOTE — Telephone Encounter (Signed)
Please advise 

## 2018-03-09 NOTE — Telephone Encounter (Signed)
Ok to refill for another 90 days

## 2018-03-13 ENCOUNTER — Other Ambulatory Visit: Payer: Self-pay | Admitting: Internal Medicine

## 2018-07-05 ENCOUNTER — Other Ambulatory Visit: Payer: Self-pay | Admitting: Internal Medicine

## 2018-07-06 ENCOUNTER — Telehealth: Payer: Self-pay

## 2018-07-06 NOTE — Telephone Encounter (Signed)
lvm pt needs virtual visit to get refill

## 2018-07-06 NOTE — Telephone Encounter (Signed)
lvm pt needs virtula visit for refill

## 2018-07-08 NOTE — Telephone Encounter (Signed)
Patient calling to check the status of refill. Advised that an appointment was need to get further refills due to not being seen in over 1 year. Patient states that she does not have insurance and that she has been on this medication her whole life and does not see why she has to keep coming to the doctor to get this medicine. Advised that Dr Regis Bill is who manages this medication and it's required that she be seen to continue receiving. Patient voices frustration asking if there is anyone that could send a few pills to last the weekend. Told patient there are no clinical staff in the office at this time that can approve that due to the office being closed for the holiday weekend. Patient states "what do I do over the weekend if I have a panic attack?" Apologized to patient and advised that she could try to go to an Urgent care to get the refill until she is able to be seen by her PCP. Patient asks what that would cost. Advised that I was uncertain and that she could contact an urgent care and check the price.

## 2018-07-11 ENCOUNTER — Ambulatory Visit (INDEPENDENT_AMBULATORY_CARE_PROVIDER_SITE_OTHER): Payer: Self-pay | Admitting: Internal Medicine

## 2018-07-11 ENCOUNTER — Encounter: Payer: Self-pay | Admitting: Internal Medicine

## 2018-07-11 ENCOUNTER — Other Ambulatory Visit: Payer: Self-pay

## 2018-07-11 DIAGNOSIS — Z79899 Other long term (current) drug therapy: Secondary | ICD-10-CM

## 2018-07-11 DIAGNOSIS — F410 Panic disorder [episodic paroxysmal anxiety] without agoraphobia: Secondary | ICD-10-CM

## 2018-07-11 DIAGNOSIS — F419 Anxiety disorder, unspecified: Secondary | ICD-10-CM

## 2018-07-11 DIAGNOSIS — R3915 Urgency of urination: Secondary | ICD-10-CM

## 2018-07-11 MED ORDER — PAROXETINE HCL 20 MG PO TABS: 20.0000 mg | ORAL_TABLET | Freq: Every day | ORAL | 1 refills | Status: DC

## 2018-07-11 NOTE — Progress Notes (Signed)
Virtual Visit via Video Note  I connected with@ on 07/11/18 at  2:00 PM EDT by a video enabled telemedicine application and verified that I am speaking with the correct person using two identifiers. Location patient: home Location provider:work  office Persons participating in the virtual visit: patient, provider  [place]th national recommendations  regarding COVID 19 pandemic   video visit is advised over in office visit for this patient.  Patient aware  of the limitations of evaluation and management by telemedicine and  availability of in person appointments. and agreed to proceed.   HPI: Taylor Swanson presents for video visit for med check   Needs refill paxil that she has used for years for rx of anxiety and panic disorder she states seem to be doing quite well since no recent attacks despite the pandemic and changes.  She is not back to work  Ft mon - fro [place] at [employer] good  Exercise on feet all day  Activity  No tobacco no setoh  Generally feels healthy  No nauseas  New recent sx of urinary " hard to go but urgent without fever constipation  Uncertain if could have uti ( moms suggestion)   ROS: See pertinent positives and negatives per HPI.  Past Medical History:  Diagnosis Date  . Anxiety   . Hypothyroid   . Self mutilation   . SELF MUTILATION 03/08/2007   Qualifier: History of  By: Regis Bill MD, Standley Brooking     Past Surgical History:  Procedure Laterality Date  . FRACTURE SURGERY     RIGHT ARM    Family History  Problem Relation Age of Onset  . Thyroid disease Mother   . Acne Father     Social History   Tobacco Use  . Smoking status: Never Smoker  . Smokeless tobacco: Never Used  Substance Use Topics  . Alcohol use: Yes    Comment: occ  . Drug use: No      Current Outpatient Medications:  .  ibuprofen (ADVIL,MOTRIN) 600 MG tablet, Take 1 tablet (600 mg total) by mouth every 8 (eight) hours as needed for moderate pain., Disp: 30 tablet, Rfl:  0 .  ondansetron (ZOFRAN) 4 MG tablet, Take 1 tablet (4 mg total) by mouth every 8 (eight) hours as needed., Disp: 20 tablet, Rfl: 1 .  PARoxetine (PAXIL) 20 MG tablet, Take 1 tablet (20 mg total) by mouth daily., Disp: 90 tablet, Rfl: 1  EXAM: BP Readings from Last 3 Encounters:  01/20/17 (!) 108/58  05/09/16 118/76  11/01/15 114/74    VITALS per patient if applicable: GENERAL: alert, oriented, appears well and in no acute distress HEENT: atraumatic, conjunttiva clear, no obvious abnormalities on inspection of external nose and ears NECK: normal movements of the head and neck LUNGS: on inspection no signs of respiratory distress, breathing rate appears normal, no obvious gross SOB, gasping or wheezing CV: no obvious cyanosis Walking  During visit outside  PSYCH/NEURO: pleasant and cooperative, no obvious depression or anxiety, speech and thought processing grossly intact Lab Results  Component Value Date   WBC 7.4 05/17/2013   HGB 14.9 05/17/2013   HCT 44.9 05/17/2013   PLT 273.0 05/17/2013   GLUCOSE 69 (L) 05/17/2013   CHOL 151 04/09/2006   HDL 39.9 04/09/2006   ALT 13 05/17/2013   AST 23 05/17/2013   NA 137 05/17/2013   K 3.8 05/17/2013   CL 101 05/17/2013   CREATININE 0.8 05/17/2013   BUN 8  05/17/2013   CO2 27 05/17/2013   TSH 0.99 05/17/2013    ASSESSMENT AND PLAN:  Discussed the following assessment and plan:    ICD-10-CM   1. Anxiety disorder, unspecified type  F41.9    appears controlled  on medication continue  2. Medication management  Z79.899   3. Panic disorder  F41.0   4. Urinary urgency  R39.15 Urinalysis with Reflex Microscopic    Urine Culture  prob not uti but  Ordered  ua reflex and u cx at [facility]  If needed . Counseled.  Expectant management  Refill paxil for 6 months and get cpx appt late in day if possible within 4-6 mos when we have these hours.   Expectant management and discussion of plan and treatment with opportunity to ask questions  and all were answered. The patient agreed with the plan and demonstrated an understanding of the instructions.   Advised to call back or seek an in-person evaluation if worsening  or having  further concerns .  Berniece AndreasWanda Quinlee Sciarra, MD

## 2018-12-07 ENCOUNTER — Other Ambulatory Visit: Payer: Self-pay | Admitting: Internal Medicine

## 2018-12-07 MED ORDER — PAROXETINE HCL 20 MG PO TABS: 20.0000 mg | ORAL_TABLET | Freq: Every day | ORAL | 0 refills | Status: DC

## 2018-12-07 NOTE — Telephone Encounter (Signed)
Medication Refill - Medication: PARoxetine (PAXIL) 20 MG tablet  Has the patient contacted their pharmacy? yes (Agent: If no, request that the patient contact the pharmacy for the refill.) (Agent: If yes, when and what did the pharmacy advise?)  Preferred Pharmacy (with phone number or street name):  [facility] ([address] [place] [phone] (Phone) [phone] (Fax)   Agent: Please be advised that RX refills may take up to 3 business days. We ask that you follow-up with your pharmacy.

## 2019-01-09 ENCOUNTER — Other Ambulatory Visit: Payer: Self-pay

## 2019-01-09 ENCOUNTER — Other Ambulatory Visit (HOSPITAL_COMMUNITY)
Admission: RE | Admit: 2019-01-09 | Discharge: 2019-01-09 | Disposition: A | Discharge status: Home / Self Care | Payer: Self-pay | Source: Ambulatory Visit | Attending: Internal Medicine | Admitting: Internal Medicine

## 2019-01-09 ENCOUNTER — Ambulatory Visit (INDEPENDENT_AMBULATORY_CARE_PROVIDER_SITE_OTHER): Payer: Self-pay | Admitting: Internal Medicine

## 2019-01-09 ENCOUNTER — Encounter: Payer: Self-pay | Admitting: Internal Medicine

## 2019-01-09 VITALS — BP 108/60 | HR 80 | Temp 97.8°F | Ht 63.0 in | Wt 103.0 lb

## 2019-01-09 DIAGNOSIS — Z0000 Encounter for general adult medical examination without abnormal findings: Secondary | ICD-10-CM

## 2019-01-09 DIAGNOSIS — Z113 Encounter for screening for infections with a predominantly sexual mode of transmission: Secondary | ICD-10-CM

## 2019-01-09 DIAGNOSIS — Z01419 Encounter for gynecological examination (general) (routine) without abnormal findings: Secondary | ICD-10-CM

## 2019-01-09 DIAGNOSIS — Z79899 Other long term (current) drug therapy: Secondary | ICD-10-CM

## 2019-01-09 DIAGNOSIS — N910 Primary amenorrhea: Secondary | ICD-10-CM

## 2019-01-09 DIAGNOSIS — F419 Anxiety disorder, unspecified: Secondary | ICD-10-CM

## 2019-01-09 DIAGNOSIS — Z124 Encounter for screening for malignant neoplasm of cervix: Secondary | ICD-10-CM | POA: Insufficient documentation

## 2019-01-09 DIAGNOSIS — L708 Other acne: Secondary | ICD-10-CM

## 2019-01-09 DIAGNOSIS — Z30011 Encounter for initial prescription of contraceptive pills: Secondary | ICD-10-CM

## 2019-01-09 LAB — BASIC METABOLIC PANEL
BUN: 11 mg/dL (ref 6–23)
CO2: 30 mEq/L (ref 19–32)
Calcium: 9.8 mg/dL (ref 8.4–10.5)
Chloride: 99 mEq/L (ref 96–112)
Creatinine, Ser: 0.6 mg/dL (ref 0.40–1.20)
GFR: 116.5 mL/min (ref >60.00)
Glucose, Bld: 81 mg/dL (ref 70–99)
Potassium: 4 mEq/L (ref 3.5–5.1)
Sodium: 137 mEq/L (ref 135–145)

## 2019-01-09 LAB — LIPID PANEL
Cholesterol: 214 mg/dL — ABNORMAL HIGH (ref 0–200)
HDL: 59 mg/dL (ref >39.00)
LDL Cholesterol: 145 mg/dL — ABNORMAL HIGH (ref 0–99)
NonHDL: 155.14
Total CHOL/HDL Ratio: 4
Triglycerides: 50 mg/dL (ref 0.0–149.0)
VLDL: 10 mg/dL (ref 0.0–40.0)

## 2019-01-09 LAB — CBC WITH DIFFERENTIAL/PLATELET
Basophils Absolute: 0.1 10*3/uL (ref 0.0–0.1)
Basophils Relative: 1.2 % (ref 0.0–3.0)
Eosinophils Absolute: 0.2 10*3/uL (ref 0.0–0.7)
Eosinophils Relative: 3.2 % (ref 0.0–5.0)
HCT: 42.2 % (ref 36.0–46.0)
Hemoglobin: 13.9 g/dL (ref 12.0–15.0)
Lymphocytes Relative: 33.3 % (ref 12.0–46.0)
Lymphs Abs: 2.3 10*3/uL (ref 0.7–4.0)
MCHC: 33.1 g/dL (ref 30.0–36.0)
MCV: 88.2 fl (ref 78.0–100.0)
Monocytes Absolute: 0.5 10*3/uL (ref 0.1–1.0)
Monocytes Relative: 7.6 % (ref 3.0–12.0)
Neutro Abs: 3.8 10*3/uL (ref 1.4–7.7)
Neutrophils Relative %: 54.7 % (ref 43.0–77.0)
Platelets: 264 10*3/uL (ref 150.0–400.0)
RBC: 4.78 Mil/uL (ref 3.87–5.11)
RDW: 14 % (ref 11.5–15.5)
WBC: 7 10*3/uL (ref 4.0–10.5)

## 2019-01-09 LAB — HEPATIC FUNCTION PANEL
ALT: 24 U/L (ref 0–35)
AST: 27 U/L (ref 0–37)
Albumin: 5 g/dL (ref 3.5–5.2)
Alkaline Phosphatase: 67 U/L (ref 39–117)
Bilirubin, Direct: 0.1 mg/dL (ref 0.0–0.3)
Total Bilirubin: 0.6 mg/dL (ref 0.2–1.2)
Total Protein: 8.1 g/dL (ref 6.0–8.3)

## 2019-01-09 LAB — POCT URINE PREGNANCY: Preg Test, Ur: NEGATIVE

## 2019-01-09 LAB — T4, FREE: Free T4: 0.54 ng/dL — ABNORMAL LOW (ref 0.60–1.60)

## 2019-01-09 LAB — TSH: TSH: 9.91 u[IU]/mL — ABNORMAL HIGH (ref 0.35–4.50)

## 2019-01-09 MED ORDER — PAROXETINE HCL 20 MG PO TABS: 20.0000 mg | ORAL_TABLET | Freq: Every day | ORAL | 3 refills | Status: DC

## 2019-01-09 MED ORDER — DROSPIRENONE-ETHINYL ESTRADIOL 3-0.02 MG PO TABS: 1.0000 | ORAL_TABLET | Freq: Every day | ORAL | 3 refills | Status: DC

## 2019-01-09 NOTE — Progress Notes (Signed)
This visit occurred during the SARS-CoV-2 public health emergency.  Safety protocols were in place, including screening questions prior to the visit, additional usage of staff PPE, and extensive cleaning of exam room while observing appropriate contact time as indicated for disinfecting solutions.    Chief Complaint  Patient presents with  . Annual Exam    meds and to discuss birth controll     HPI: Patient  Taylor Swanson  34 y.o. comes in today for Preventive Health Care visit   And med check and to begin contraception m  She is now working  At   [employer] .   And has a new BF interested in ocps   Using condoms but broke one time.  ocass panic attacks .  But doing well on the paxil.   Rare need for zofran. Acne  Ok  Interested  In ocps to help acne also  Periods  Reg  So far  Condoms   Every other month   Bleeds  5-6 days.  LMP  November  20 .  Sometimes skips   Health Maintenance  Topic Date Due  . HIV Screening  11/19/2002  . PAP SMEAR-Modifier  01/09/2019 (Originally 02/23/2015)  . INFLUENZA VACCINE  04/05/2019 (Originally 08/06/2018)  . TETANUS/TDAP  10/15/2025   Health Maintenance Review LIFESTYLE:  Exercise:   On feet all day . Tobacco/ETS:  no Alcohol:   ocass Sugar beverages:  Limiting  Sweet tea   Sleep: about 10 hours  Drug use: no HH of    1  [employer]erself and13  [employer]  Work: 30 hours   ROS:  GEN/ HEENT: No fever, significant weight changes sweats headaches vision problems hearing changes, CV/ PULM; No chest pain shortness of breath cough, syncope,edema  change in exercise tolerance. GI /GU: No adominal pain, vomiting, change in bowel habits. No blood in the stool. No significant GU symptoms. SKIN/HEME: ,no acute skin rashes suspicious lesions or bleeding. No lymphadenopathy, nodules, masses.  NEURO/ PSYCH:  No neurologic signs such as weakness numbness. No depression anxiety. IMM/ Allergy: No unusual infections.  Allergy .   REST of 12 system review  negative except as per HPI   Past Medical History:  Diagnosis Date  . Anxiety   . Hypothyroid   . Self mutilation   . SELF MUTILATION 03/08/2007   Qualifier: History of  By: Fabian Sharp MD, Neta Mends     Past Surgical History:  Procedure Laterality Date  . FRACTURE SURGERY     RIGHT ARM    Family History  Problem Relation Age of Onset  . Thyroid disease Mother   . Acne Father       Outpatient Medications Prior to Visit  Medication Sig Dispense Refill  . ibuprofen (ADVIL,MOTRIN) 600 MG tablet Take 1 tablet (600 mg total) by mouth every 8 (eight) hours as needed for moderate pain. 30 tablet 0  . ondansetron (ZOFRAN) 4 MG tablet Take 1 tablet (4 mg total) by mouth every 8 (eight) hours as needed. 20 tablet 1  . PARoxetine (PAXIL) 20 MG tablet Take 1 tablet (20 mg total) by mouth daily. 90 tablet 0   No facility-administered medications prior to visit.     EXAM:  BP 108/60 (BP Location: Right Arm, Patient Position: Sitting, Cuff Size: Normal)   Pulse 80   Temp 97.8 F (36.6 C) (Temporal)   Ht 5\' 3"  (1.6 m)   Wt 103 lb (46.7 kg)   SpO2 98%  BMI 18.25 kg/m   Body mass index is 18.25 kg/m. Wt Readings from Last 3 Encounters:  01/09/19 103 lb (46.7 kg)  01/20/17 94 lb 12.8 oz (43 kg)  05/09/16 95 lb (43.1 kg)    Physical Exam: Vital signs reviewed EYC:XKGY is a well-developed well-nourished alert cooperative    who appearsr stated age in no acute distress.  HEENT: normocephalic atraumatic , Eyes: PERRL EOM's full, conjunctiva clear, Nares: paten,t no deformity discharge or tenderness., Ears: no deformity EAC's clear TMs with normal landmarks. Mouth: clear OP, masked NECK: supple without masses, thyromegaly or bruits. CHEST/PULM:  Clear to auscultation and percussion breath sounds equal no wheeze , rales or rhonchi. No chest wall deformities or tenderness. Breast: normal by inspection . No dimpling, discharge, masses, tenderness or discharge . CV: PMI is nondisplaced, S1  S2 no gallops, murmurs, rubs. Peripheral pulses are full without delay.No JVD .  ABDOMEN: Bowel sounds normal nontender  No guard or rebound, no hepato splenomegal no CVA tenderness.  No hernia. Extremtities:  No clubbing cyanosis or edema, no acute joint swelling or redness no focal atrophy NEURO:  Oriented x3, cranial nerves 3-12 appear to be intact, no obvious focal weakness,gait within normal limits no abnormal reflexes or asymmetrical SKIN: No acute rashes normal turgor, color, no bruising or petechiae. PSYCH: Oriented, good eye contact, no obvious depression anxiety, cognition and judgment appear normal. LN: no cervical axillary inguinal adenopathy Pelvic: NL ext GU, labia clear without lesions or rash . Vagina no lesions .Cervix: clear  Mild dc white  UTERUS: Neg CMT Adnexa:  clear no masses . PAP done w HR hpv and gc chl screen     BP Readings from Last 3 Encounters:  01/09/19 108/60  01/20/17 (!) 108/58  05/09/16 118/76    Lab rplan  reviewed with patient   ASSESSMENT AND PLAN:  Discussed the following assessment and plan:    ICD-10-CM   1. Visit for preventive health examination  Z00.00 Basic metabolic panel    CBC with Differential    Hepatic function panel    Lipid panel    TSH    T4, Free (Thyrox)    HIV Antibody (Reflex)    RPR ([facility]ph[facility]is)  2. Anxiety disorder, unspecified type  F41.9   3. Medication management  Z79.899 Basic metabolic panel    CBC with Differential    Hepatic function panel    Lipid panel    TSH    T4, Free (Thyrox)    HIV Antibody (Reflex)    RPR (Syphillis)  4. Routine screening for STI (sexually transmitted infection)  Z11.3 Basic metabolic panel    CBC with Differential    Hepatic function panel    Lipid panel    TSH    T4, Free (Thyrox)    HIV Antibody (Reflex)    RPR (Syphillis)  5. 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 Screening for cervical cancer  Z12.4 PAP [[facility]]    CANCELED: PAP [[facility]]  7. Delayed  menses  N91.0 POCT urine pregnancy   hx of same  poct preg test neg today  8. OCP (oral contraceptive pills) initiation  Z30.011   9. Encounter for gynecological examination without abnormal finding  Z01.419   disc ocps other options   and sti prevention    And sti screen today   ocps a good option to also help with acne  And  Period regulation  Begin yaz  Plan ROV  vv ok in about 3 mos   Or as needed   Continue paxil seems to stiull do well for her at this time    Patient Care Team: Jenesis Martin, Neta Mends, MD as PCP - General Mitchel Honour, DO as Attending Physician (Obstetrics and Gynecology) Patient Instructions  Can begin ocps   If  Pregnancy test negative . Plan virtual visit  In  About 3 months  Will notify you  of labs when available.      Oral Contraception Use Oral contraceptive pills (OCPs) are medicines that you take to prevent pregnancy. OCPs work by:  Preventing the ovaries from releasing eggs.  Thickening mucus in the lower part of the uterus (cervix), which prevents sperm from entering the uterus.  Thinning the lining of the uterus (endometrium), which prevents a fertilized egg from attaching to the endometrium. OCPs are highly effective when taken exactly as prescribed. However, OCPs do not prevent sexually transmitted infections (STIs). Safe sex practices, such as using condoms while on an OCP, can help prevent STIs. Before taking OCPs, you may have a physical exam, blood test, and Pap test. A Pap test involves taking a sample of cells from your cervix to check for cancer. Discuss with your health care provider the possible side effects of the OCP you may be prescribed. When you start an OCP, be aware that it can take 2-3 months for your body to adjust to changes in hormone levels. How to take oral contraceptive pills Follow instructions from your health care provider about how to start taking your first cycle of OCPs. Your health care provider may recommend that  you:  Start the pill on day 1 of your menstrual period. If you start at this time, you will not need any backup form of birth control (contraception), such as condoms.  Start the pill on the first Sunday after your menstrual period or on the day you get your prescription. In these cases, you will need to use backup contraception for the first week.  Start the pill at any time of your cycle. ? If you take the pill within 5 days of the start of your period, you will not need a backup form of contraception. ? If you start at any other time of your menstrual cycle, you will need to use another form of contraception for 7 days. If your OCP is the type called a minipill, it will protect you from pregnancy after taking it for 2 days (48 hours), and you can stop using backup contraception after that time. After you have started taking OCPs:  If you forget to take 1 pill, take it as soon as you remember. Take the next pill at the regular time.  If you miss 2 or more pills, call your health care provider. Different pills have different instructions for missed doses. Use backup birth control until your next menstrual period starts.  If you use a 28-day pack that contains inactive pills and you miss 1 of the last 7 pills (pills with no hormones), throw away the rest of the non-hormone pills and start a new pill pack. No matter which day you start the OCP, you will always start a new pack on that same day of the week. Have an extra pack of OCPs and a backup contraceptive method available in case you miss some pills or lose your OCP pack. Follow these instructions at home:  Do not use any products that contain nicotine or tobacco, such  as cigarettes and e-cigarettes. If you need help quitting, ask your health care provider.  Always use a condom to protect against STIs. OCPs do not protect against STIs.  Use a calendar to mark the days of your menstrual period.  Read the information and directions that  came with your OCP. Talk to your health care provider if you have questions. Contact a health care provider if:  You develop nausea and vomiting.  You have abnormal vaginal discharge or bleeding.  You develop a rash.  You miss your menstrual period. Depending on the type of OCP you are taking, this may be a sign of pregnancy. Ask your health care provider for more information.  You are losing your hair.  You need treatment for mood swings or depression.  You get dizzy when taking the OCP.  You develop acne after taking the OCP.  You become pregnant or think you may be pregnant.  You have diarrhea, constipation, and abdominal pain or cramps.  You miss 2 or more pills. Get help right away if:  You develop chest pain.  You develop shortness of breath.  You have an uncontrolled or severe headache.  You develop numbness or slurred speech.  You develop visual or speech problems.  You develop pain, redness, and swelling in your legs.  You develop weakness or numbness in your arms or legs. Summary  Oral contraceptive pills (OCPs) are medicines that you take to prevent pregnancy.  OCPs do not prevent sexually transmitted infections (STIs). Always use a condom to protect against STIs.  When you start an OCP, be aware that it can take 2-3 months for your body to adjust to changes in hormone levels.  Read all the information and directions that come with your OCP. This information is not intended to replace advice given to you by your health care provider. Make sure you discuss any questions you have with your health care provider. Document Revised: 04/15/2018 Document Reviewed: 02/03/2016 Elsevier Patient Education  2020 [place] Annibelle Brazie M.D.

## 2019-01-09 NOTE — Patient Instructions (Addendum)
Can begin ocps   If  Pregnancy test negative . Plan virtual visit  In  About 3 months  Will notify you  of labs when available.      Oral Contraception Use Oral contraceptive pills (OCPs) are medicines that you take to prevent pregnancy. OCPs work by:  Preventing the ovaries from releasing eggs.  Thickening mucus in the lower part of the uterus (cervix), which prevents sperm from entering the uterus.  Thinning the lining of the uterus (endometrium), which prevents a fertilized egg from attaching to the endometrium. OCPs are highly effective when taken exactly as prescribed. However, OCPs do not prevent sexually transmitted infections (STIs). Safe sex practices, such as using condoms while on an OCP, can help prevent STIs. Before taking OCPs, you may have a physical exam, blood test, and Pap test. A Pap test involves taking a sample of cells from your cervix to check for cancer. Discuss with your health care provider the possible side effects of the OCP you may be prescribed. When you start an OCP, be aware that it can take 2-3 months for your body to adjust to changes in hormone levels. How to take oral contraceptive pills Follow instructions from your health care provider about how to start taking your first cycle of OCPs. Your health care provider may recommend that you:  Start the pill on day 1 of your menstrual period. If you start at this time, you will not need any backup form of birth control (contraception), such as condoms.  Start the pill on the first Sunday after your menstrual period or on the day you get your prescription. In these cases, you will need to use backup contraception for the first week.  Start the pill at any time of your cycle. ? If you take the pill within 5 days of the start of your period, you will not need a backup form of contraception. ? If you start at any other time of your menstrual cycle, you will need to use another form of contraception for 7 days. If  your OCP is the type called a minipill, it will protect you from pregnancy after taking it for 2 days (48 hours), and you can stop using backup contraception after that time. After you have started taking OCPs:  If you forget to take 1 pill, take it as soon as you remember. Take the next pill at the regular time.  If you miss 2 or more pills, call your health care provider. Different pills have different instructions for missed doses. Use backup birth control until your next menstrual period starts.  If you use a 28-day pack that contains inactive pills and you miss 1 of the last 7 pills (pills with no hormones), throw away the rest of the non-hormone pills and start a new pill pack. No matter which day you start the OCP, you will always start a new pack on that same day of the week. Have an extra pack of OCPs and a backup contraceptive method available in case you miss some pills or lose your OCP pack. Follow these instructions at home:  Do not use any products that contain nicotine or tobacco, such as cigarettes and e-cigarettes. If you need help quitting, ask your health care provider.  Always use a condom to protect against STIs. OCPs do not protect against STIs.  Use a calendar to mark the days of your menstrual period.  Read the information and directions that came with your OCP. Talk to  your health care provider if you have questions. Contact a health care provider if:  You develop nausea and vomiting.  You have abnormal vaginal discharge or bleeding.  You develop a rash.  You miss your menstrual period. Depending on the type of OCP you are taking, this may be a sign of pregnancy. Ask your health care provider for more information.  You are losing your hair.  You need treatment for mood swings or depression.  You get dizzy when taking the OCP.  You develop acne after taking the OCP.  You become pregnant or think you may be pregnant.  You have diarrhea, constipation, and  abdominal pain or cramps.  You miss 2 or more pills. Get help right away if:  You develop chest pain.  You develop shortness of breath.  You have an uncontrolled or severe headache.  You develop numbness or slurred speech.  You develop visual or speech problems.  You develop pain, redness, and swelling in your legs.  You develop weakness or numbness in your arms or legs. Summary  Oral contraceptive pills (OCPs) are medicines that you take to prevent pregnancy.  OCPs do not prevent sexually transmitted infections (STIs). Always use a condom to protect against STIs.  When you start an OCP, be aware that it can take 2-3 months for your body to adjust to changes in hormone levels.  Read all the information and directions that come with your OCP. This information is not intended to replace advice given to you by your health care provider. Make sure you discuss any questions you have with your health care provider. Document Revised: 04/15/2018 Document Reviewed: 02/03/2016 Elsevier Patient Education  [place].

## 2019-01-10 LAB — RPR: RPR Ser Ql: NONREACTIVE

## 2019-01-10 LAB — HIV ANTIBODY (ROUTINE TESTING W REFLEX): HIV 1&2 Ab, 4th Generation: NONREACTIVE

## 2019-01-10 NOTE — Progress Notes (Signed)
So labs so far show  thyroid is off  and  should probably be on thyroid medication  .. however we can  have you see an endocrinologist  specialist to   give opinion since this happened before and then was ok  rest of labs are   ok. Cholesterol up some  probably from thyroid being off.  Let us know if you want a consult referral  or we can begin you on medication for thyroid  and follow up.

## 2019-01-12 LAB — CYTOLOGY - PAP
Chlamydia: NEGATIVE
Comment: NEGATIVE
Comment: NEGATIVE
Comment: NORMAL
Diagnosis: NEGATIVE
High risk HPV: NEGATIVE
Neisseria Gonorrhea: NEGATIVE

## 2019-01-27 ENCOUNTER — Other Ambulatory Visit: Payer: Self-pay

## 2019-01-27 ENCOUNTER — Telehealth (INDEPENDENT_AMBULATORY_CARE_PROVIDER_SITE_OTHER): Payer: Self-pay | Admitting: Internal Medicine

## 2019-01-27 ENCOUNTER — Encounter: Payer: Self-pay | Admitting: Internal Medicine

## 2019-01-27 DIAGNOSIS — Z79899 Other long term (current) drug therapy: Secondary | ICD-10-CM

## 2019-01-27 DIAGNOSIS — Z3041 Encounter for surveillance of contraceptive pills: Secondary | ICD-10-CM

## 2019-01-27 DIAGNOSIS — R109 Unspecified abdominal pain: Secondary | ICD-10-CM

## 2019-01-27 DIAGNOSIS — E039 Hypothyroidism, unspecified: Secondary | ICD-10-CM

## 2019-01-27 MED ORDER — LEVOTHYROXINE SODIUM 50 MCG PO TABS: 50.0000 ug | ORAL_TABLET | Freq: Every day | ORAL | 1 refills | Status: DC

## 2019-01-27 MED ORDER — DROSPIRENONE-ETHINYL ESTRADIOL 3-0.03 MG PO TABS: 1.0000 | ORAL_TABLET | Freq: Every day | ORAL | 5 refills | Status: DC

## 2019-01-27 NOTE — Progress Notes (Signed)
Virtual Visit via Video Note  I connected with@ on 01/27/19 at  3:00 PM EST by a video enabled telemedicine application and verified that I am speaking with the correct person using two identifiers. Location patient: home/work Location provider:work office Persons participating in the virtual visit: patient, provider  [place] national recommendations  regarding COVID 19 pandemic   video visit is advised over in office visit for this patient.  Patient aware  of the limitations of evaluation and management by telemedicine and  availability of in person appointments. and agreed to proceed.   HPI: Taylor Swanson presents for video visit  Into the 3rd week of yaz and having lower abd cramps like nestrual  And makers her nervous. Not sure  What to do next    Her last check neg sti and preg was negative    Noted her thyroid was abnormal with elevated  tsh as in past  And low t4  At this time she  Decided to go ahead and begin syntroid  And go from there . ROS: See pertinent positives and negatives per HPI. No fever uti sx   Past Medical History:  Diagnosis Date  . Anxiety   . Hypothyroid   . Self mutilation   . SELF MUTILATION 03/08/2007   Qualifier: History of  By: Regis Bill MD, Standley Brooking     Past Surgical History:  Procedure Laterality Date  . FRACTURE SURGERY     RIGHT ARM    Family History  Problem Relation Age of Onset  . Thyroid disease Mother   . Acne Father     Social History   Tobacco Use  . Smoking status: Never Smoker  . Smokeless tobacco: Never Used  Substance Use Topics  . Alcohol use: Yes    Comment: occ  . Drug use: No      Current Outpatient Medications:  .  drospirenone-ethinyl estradiol (YASMIN 28) 3-0.03 MG tablet, Take 1 tablet by mouth daily., Disp: 1 Package, Rfl: 5 .  ibuprofen (ADVIL,MOTRIN) 600 MG tablet, Take 1 tablet (600 mg total) by mouth every 8 (eight) hours as needed for moderate pain., Disp: 30 tablet, Rfl: 0 .  levothyroxine (SYNTHROID) 50 MCG  tablet, Take 1 tablet (50 mcg total) by mouth daily., Disp: 90 tablet, Rfl: 1 .  ondansetron (ZOFRAN) 4 MG tablet, Take 1 tablet (4 mg total) by mouth every 8 (eight) hours as needed., Disp: 20 tablet, Rfl: 1 .  PARoxetine (PAXIL) 20 MG tablet, Take 1 tablet (20 mg total) by mouth daily., Disp: 90 tablet, Rfl: 3  EXAM: BP Readings from Last 3 Encounters:  01/09/19 108/60  01/20/17 (!) 108/58  05/09/16 118/76    VITALS per patient if applicable:  GENERAL: alert, oriented, appears well and in no acute distress  HEENT: atraumatic, conjunttiva clear, no obvious abnormalities on inspection of external nose and ears  NECK: normal movements of the head and neck  LUNGS: on inspection no signs of respiratory distress, breathing rate appears normal, no obvious gross SOB, gasping or wheezing  CV: no obvious cyanosis  PSYCH/NEURO: pleasant and cooperative, no obvious depression or anxiety, speech and thought processing grossly intact Lab Results  Component Value Date   WBC 7.0 01/09/2019   HGB 13.9 01/09/2019   HCT 42.2 01/09/2019   PLT 264.0 01/09/2019   GLUCOSE 81 01/09/2019   CHOL 214 (H) 01/09/2019   TRIG 50.0 01/09/2019   HDL 59.00 01/09/2019   LDLCALC 145 (H) 01/09/2019   ALT 24 01/09/2019  AST 27 01/09/2019   NA 137 01/09/2019   K 4.0 01/09/2019   CL 99 01/09/2019   CREATININE 0.60 01/09/2019   BUN 11 01/09/2019   CO2 30 01/09/2019   TSH 9.91 (H) 01/09/2019    ASSESSMENT AND PLAN:  Discussed the following assessment and plan: Abdominal cramping  Medication management - Plan: TSH, T4, free, Thyroid antibodies  Hypothyroidism, unspecified type - Plan: TSH, T4, free, Thyroid antibodies  Oral contraceptive use   Anxiety about above    Get a second  UPT to be confirm negative  And if ok then change  To Yasmin  Sent in to pharmacy . Begin synthroid 50 mcg per day  And check tsh free t4 aby in about 6-8 weeks and fu   Vv or other    Counseled.   Expectant  management and discussion of plan and treatment with opportunity to ask questions and all were answered. The patient agreed with the plan and demonstrated an understanding of the instructions.   Advised to call back or seek an in-person evaluation if worsening  or having  further concerns . Return for lab and fu 6-8 weeks or as needed .    Berniece Andreas, MD

## 2019-04-10 ENCOUNTER — Telehealth: Payer: Self-pay | Admitting: Internal Medicine

## 2019-04-10 NOTE — Progress Notes (Deleted)
Virtual Visit via Video Note  I connected with@ on 04/10/19 at  8:30 AM EDT by a video enabled telemedicine application and verified that I am speaking with the correct person using two identifiers. Location patient: home Location provider:work office Persons participating in the virtual visit: patient, provider  [place] national recommendations  regarding COVID 19 pandemic   video visit is advised over in office visit for this patient.  Patient aware  of the limitations of evaluation and management by telemedicine and  availability of in person appointments. and agreed to proceed.   HPI: Taylor Swanson presents for video visit  Thyroid Anxiety  Periods     ROS: See pertinent positives and negatives per HPI.  Past Medical History:  Diagnosis Date  . Anxiety   . Hypothyroid   . Self mutilation   . SELF MUTILATION 03/08/2007   Qualifier: History of  By: Fabian Sharp MD, Neta Mends     Past Surgical History:  Procedure Laterality Date  . FRACTURE SURGERY     RIGHT ARM    Family History  Problem Relation Age of Onset  . Thyroid disease Mother   . Acne Father     Social History   Tobacco Use  . Smoking status: Never Smoker  . Smokeless tobacco: Never Used  Substance Use Topics  . Alcohol use: Yes    Comment: occ  . Drug use: No      Current Outpatient Medications:  .  drospirenone-ethinyl estradiol (YASMIN 28) 3-0.03 MG tablet, Take 1 tablet by mouth daily., Disp: 1 Package, Rfl: 5 .  ibuprofen (ADVIL,MOTRIN) 600 MG tablet, Take 1 tablet (600 mg total) by mouth every 8 (eight) hours as needed for moderate pain., Disp: 30 tablet, Rfl: 0 .  levothyroxine (SYNTHROID) 50 MCG tablet, Take 1 tablet (50 mcg total) by mouth daily., Disp: 90 tablet, Rfl: 1 .  ondansetron (ZOFRAN) 4 MG tablet, Take 1 tablet (4 mg total) by mouth every 8 (eight) hours as needed., Disp: 20 tablet, Rfl: 1 .  PARoxetine (PAXIL) 20 MG tablet, Take 1 tablet (20 mg total) by mouth daily., Disp: 90 tablet,  Rfl: 3  EXAM: BP Readings from Last 3 Encounters:  01/09/19 108/60  01/20/17 (!) 108/58  05/09/16 118/76    VITALS per patient if applicable:  GENERAL: alert, oriented, appears well and in no acute distress  HEENT: atraumatic, conjunttiva clear, no obvious abnormalities on inspection of external nose and ears  NECK: normal movements of the head and neck  LUNGS: on inspection no signs of respiratory distress, breathing rate appears normal, no obvious gross SOB, gasping or wheezing  CV: no obvious cyanosis  MS: moves all visible extremities without noticeable abnormality  PSYCH/NEURO: pleasant and cooperative, no obvious depression or anxiety, speech and thought processing grossly intact Lab Results  Component Value Date   WBC 7.0 01/09/2019   HGB 13.9 01/09/2019   HCT 42.2 01/09/2019   PLT 264.0 01/09/2019   GLUCOSE 81 01/09/2019   CHOL 214 (H) 01/09/2019   TRIG 50.0 01/09/2019   HDL 59.00 01/09/2019   LDLCALC 145 (H) 01/09/2019   ALT 24 01/09/2019   AST 27 01/09/2019   NA 137 01/09/2019   K 4.0 01/09/2019   CL 99 01/09/2019   CREATININE 0.60 01/09/2019   BUN 11 01/09/2019   CO2 30 01/09/2019   TSH 9.91 (H) 01/09/2019    ASSESSMENT AND PLAN:  Discussed the following assessment and plan:    ICD-10-CM   1. Medication management  Z79.899   2. Hypothyroidism, unspecified type  E03.9   3. Anxiety disorder, unspecified type  F41.9   4. Abdominal cramping  R10.9   5. Oral contraceptive use  Z30.41    Get  Repeat labs as planned    Counseled.   Expectant management and discussion of plan and treatment with opportunity to ask questions and all were answered. The patient agreed with the plan and demonstrated an understanding of the instructions.   Advised to call back or seek an in-person evaluation if worsening  or having  further concerns . No follow-ups on file. I provided *** minutes of non-face-to-face time during this encounter.   Shanon Ace, MD

## 2019-08-11 ENCOUNTER — Other Ambulatory Visit: Payer: Self-pay | Admitting: Internal Medicine

## 2019-09-05 ENCOUNTER — Other Ambulatory Visit: Payer: Self-pay | Admitting: Internal Medicine

## 2019-10-10 ENCOUNTER — Other Ambulatory Visit: Payer: Self-pay | Admitting: Internal Medicine

## 2019-10-12 ENCOUNTER — Other Ambulatory Visit: Payer: Self-pay | Admitting: Internal Medicine

## 2020-01-26 ENCOUNTER — Other Ambulatory Visit: Payer: Self-pay | Admitting: Internal Medicine

## 2020-01-29 ENCOUNTER — Telehealth: Payer: Self-pay | Admitting: Internal Medicine

## 2020-01-29 DIAGNOSIS — F419 Anxiety disorder, unspecified: Secondary | ICD-10-CM

## 2020-01-29 NOTE — Telephone Encounter (Signed)
Last office visit---01/09/2019 Last refill-- 01/09/2019---90 tabs with 3 refills  Can this patient receive a refill

## 2020-01-29 NOTE — Telephone Encounter (Signed)
Patient is requesting a refill on PARoxetine (PAXIL) 20 MG tablet.  [facility]acy: [facility] [address].  Please advise.

## 2020-01-29 NOTE — Telephone Encounter (Signed)
Please refill x1 dispense 90 for the Paxil. Then arrange lab appointment and office visit yearly to review.  She is overdue for blood work see orders from last January TSH free T4 and thyroid antibodies Please arrange lab test fasting not needed.   If she taking her thyroid medicine daily ?  Can refill to not run out until she has lab and visit

## 2020-01-30 ENCOUNTER — Telehealth: Payer: Self-pay | Admitting: Internal Medicine

## 2020-01-30 DIAGNOSIS — F419 Anxiety disorder, unspecified: Secondary | ICD-10-CM

## 2020-01-30 DIAGNOSIS — I10 Essential (primary) hypertension: Secondary | ICD-10-CM

## 2020-01-30 DIAGNOSIS — Z0000 Encounter for general adult medical examination without abnormal findings: Secondary | ICD-10-CM

## 2020-01-30 DIAGNOSIS — Z79899 Other long term (current) drug therapy: Secondary | ICD-10-CM

## 2020-01-30 DIAGNOSIS — R636 Underweight: Secondary | ICD-10-CM

## 2020-01-30 DIAGNOSIS — E039 Hypothyroidism, unspecified: Secondary | ICD-10-CM

## 2020-01-30 MED ORDER — PAROXETINE HCL 20 MG PO TABS: 20.0000 mg | ORAL_TABLET | Freq: Every day | ORAL | 1 refills | Status: DC

## 2020-01-30 NOTE — Addendum Note (Signed)
Addended by: Charlyne Mom D on: 01/30/2020 09:35 AM   Modules accepted: Orders

## 2020-01-30 NOTE — Telephone Encounter (Signed)
Patient is returning a call.  She states she is at work and she does not get off until 3 pm today.  She asks that if we call back before then to leave a detailed message on her voicemail.

## 2020-01-30 NOTE — Telephone Encounter (Signed)
I called and spoke with patient and scheduled her for labs and to see the provider and I informed her that I sent her medication Paxil to the pharmacy.  She understood.  Duan Scharnhorst,cma

## 2020-02-02 ENCOUNTER — Other Ambulatory Visit (INDEPENDENT_AMBULATORY_CARE_PROVIDER_SITE_OTHER): Payer: 59

## 2020-02-02 ENCOUNTER — Other Ambulatory Visit: Payer: Self-pay

## 2020-02-02 DIAGNOSIS — I10 Essential (primary) hypertension: Secondary | ICD-10-CM | POA: Diagnosis not present

## 2020-02-02 DIAGNOSIS — R636 Underweight: Secondary | ICD-10-CM

## 2020-02-02 DIAGNOSIS — E039 Hypothyroidism, unspecified: Secondary | ICD-10-CM

## 2020-02-02 DIAGNOSIS — Z0000 Encounter for general adult medical examination without abnormal findings: Secondary | ICD-10-CM | POA: Diagnosis not present

## 2020-02-02 DIAGNOSIS — Z79899 Other long term (current) drug therapy: Secondary | ICD-10-CM | POA: Diagnosis not present

## 2020-02-02 LAB — HEPATIC FUNCTION PANEL
ALT: 11 U/L (ref 0–35)
AST: 13 U/L (ref 0–37)
Albumin: 4.2 g/dL (ref 3.5–5.2)
Alkaline Phosphatase: 50 U/L (ref 39–117)
Bilirubin, Direct: 0.1 mg/dL (ref 0.0–0.3)
Total Bilirubin: 0.3 mg/dL (ref 0.2–1.2)
Total Protein: 7.2 g/dL (ref 6.0–8.3)

## 2020-02-02 LAB — TSH: TSH: 5.39 u[IU]/mL — ABNORMAL HIGH (ref 0.35–4.50)

## 2020-02-02 LAB — T4, FREE: Free T4: 0.48 ng/dL — ABNORMAL LOW (ref 0.60–1.60)

## 2020-02-02 LAB — BASIC METABOLIC PANEL
BUN: 12 mg/dL (ref 6–23)
CO2: 29 mEq/L (ref 19–32)
Calcium: 9.1 mg/dL (ref 8.4–10.5)
Chloride: 104 mEq/L (ref 96–112)
Creatinine, Ser: 0.76 mg/dL (ref 0.40–1.20)
GFR: 103.84 mL/min (ref >60.00)
Glucose, Bld: 127 mg/dL — ABNORMAL HIGH (ref 70–99)
Potassium: 4.4 mEq/L (ref 3.5–5.1)
Sodium: 138 mEq/L (ref 135–145)

## 2020-02-02 LAB — CBC WITH DIFFERENTIAL/PLATELET
Basophils Absolute: 0.1 10*3/uL (ref 0.0–0.1)
Basophils Relative: 1 % (ref 0.0–3.0)
Eosinophils Absolute: 0.1 10*3/uL (ref 0.0–0.7)
Eosinophils Relative: 2.1 % (ref 0.0–5.0)
HCT: 37.6 % (ref 36.0–46.0)
Hemoglobin: 12.6 g/dL (ref 12.0–15.0)
Lymphocytes Relative: 35 % (ref 12.0–46.0)
Lymphs Abs: 2.4 10*3/uL (ref 0.7–4.0)
MCHC: 33.4 g/dL (ref 30.0–36.0)
MCV: 86.6 fl (ref 78.0–100.0)
Monocytes Absolute: 0.3 10*3/uL (ref 0.1–1.0)
Monocytes Relative: 4.7 % (ref 3.0–12.0)
Neutro Abs: 3.9 10*3/uL (ref 1.4–7.7)
Neutrophils Relative %: 57.2 % (ref 43.0–77.0)
Platelets: 264 10*3/uL (ref 150.0–400.0)
RBC: 4.35 Mil/uL (ref 3.87–5.11)
RDW: 13.1 % (ref 11.5–15.5)
WBC: 6.7 10*3/uL (ref 4.0–10.5)

## 2020-02-02 LAB — LIPID PANEL
Cholesterol: 182 mg/dL (ref 0–200)
HDL: 46.3 mg/dL (ref >39.00)
LDL Cholesterol: 109 mg/dL — ABNORMAL HIGH (ref 0–99)
NonHDL: 135.27
Total CHOL/HDL Ratio: 4
Triglycerides: 132 mg/dL (ref 0.0–149.0)
VLDL: 26.4 mg/dL (ref 0.0–40.0)

## 2020-02-04 NOTE — Progress Notes (Signed)
Chief Complaint  Patient presents with   Follow-up    Go over labs    HPI: Taylor Swanson 35 y.o. come in for Chronic  management   Med evaluation Thyroid she has been off thyroid medicine for quite a while felt like she could not take it in the morning when her work schedule was changed did not realize she could take it later without problem. Sad: paxil  Anxiety panic disorder   20 mg at night  ocass  Panic  .  But feels she is doing well Gi sx  Attributed to anxiety  Constipation at timesNl  Appetite   lives by self  Cats dogs and chickens   working  At [employer].   About 40  Hours.  Rare etoh .    Rare mj.  Living alone Has BF Periods  regular   On ocps seems to be helpful. ROS: See pertinent positives and negatives per HPI.  Past Medical History:  Diagnosis Date   Anxiety    Hypothyroid    Self mutilation    SELF MUTILATION 03/08/2007   Qualifier: History of  By: Fabian Sharp MD, Neta Mends     Family History  Problem Relation Age of Onset   Thyroid disease Mother    Acne Father     Social History   Socioeconomic History   Marital status: Single    Spouse name: Not on file   Number of children: Not on file   Years of education: Not on file   Highest education level: Not on file  Occupational History   Not on file  Tobacco Use   Smoking status: Never Smoker   Smokeless tobacco: Never Used  Vaping Use   Vaping Use: Never used  Substance and Sexual Activity   Alcohol use: Yes    Comment: occ   Drug use: No   Sexual activity: Never    Birth control/protection: None  Other Topics Concern   Not on file  Social History Narrative   Single   Regular exercise- yes sometimes    ? Tad at home finishing hs on line [employer] to get hs diploma         Lives in her household with 9 cats and dogs likes animals takes care of them.    trying to get her driver's license.      Neg tad    Social Determinants of Health   Financial Resource  Strain: Not on file  Food Insecurity: Not on file  Transportation Needs: Not on file  Physical Activity: Not on file  Stress: Not on file  Social Connections: Not on file    Outpatient Medications Prior to Visit  Medication Sig Dispense Refill   drospirenone-ethinyl estradiol (YASMIN) 3-0.03 MG tablet Take 1 tablet by mouth daily. 28 tablet 3   PARoxetine (PAXIL) 20 MG tablet Take 1 tablet (20 mg total) by mouth daily. 90 tablet 1   ibuprofen (ADVIL,MOTRIN) 600 MG tablet Take 1 tablet (600 mg total) by mouth every 8 (eight) hours as needed for moderate pain. (Patient not taking: Reported on 02/05/2020) 30 tablet 0   ondansetron (ZOFRAN) 4 MG tablet Take 1 tablet (4 mg total) by mouth every 8 (eight) hours as needed. (Patient not taking: Reported on 02/05/2020) 20 tablet 1   levothyroxine (SYNTHROID) 50 MCG tablet Take 1 tablet (50 mcg total) by mouth daily. (Patient not taking: Reported on 02/05/2020) 90 tablet 1   No facility-administered medications prior to  visit.     EXAM:  BP 110/80 (BP Location: Right Arm, Patient Position: Sitting, Cuff Size: Normal)    Pulse 82    Temp 98.4 F (36.9 C) (Oral)    Ht 5\' 4"  (1.626 m)    Wt 110 lb 12.8 oz (50.3 kg)    SpO2 99%    BMI 19.02 kg/m   Body mass index is 19.02 kg/m.  GENERAL: vitals reviewed and listed above, alert, oriented, appears well hydrated and in no acute distress HEENT: atraumatic, conjunctiva  clear, no obvious abnormalities on inspection of external nose and ears OP masked NECK: no obvious masses on inspection palpation thyroif palpable no nodules felt  LUNGS: clear to auscultation bilaterally, no wheezes, rales or rhonchi, good air movement CV: HRRR, no clubbing cyanosis or  peripheral edema nl cap refill  Abdomen:  Sof,t normal bowel sounds without hepatosplenomegaly, no guarding rebound or masses no CVA tenderness MS: moves all extremities without noticeable focal  abnormality PSYCH: pleasant and cooperative, no  obvious depression or anxiety Lab Results  Component Value Date   WBC 6.7 02/02/2020   HGB 12.6 02/02/2020   HCT 37.6 02/02/2020   PLT 264.0 02/02/2020   GLUCOSE 127 (H) 02/02/2020   CHOL 182 02/02/2020   TRIG 132.0 02/02/2020   HDL 46.30 02/02/2020   LDLCALC 109 (H) 02/02/2020   ALT 11 02/02/2020   AST 13 02/02/2020   NA 138 02/02/2020   K 4.4 02/02/2020   CL 104 02/02/2020   CREATININE 0.76 02/02/2020   BUN 12 02/02/2020   CO2 29 02/02/2020   TSH 5.39 (H) 02/02/2020   BP Readings from Last 3 Encounters:  02/05/20 110/80  01/09/19 108/60  01/20/17 (!) 108/58   Wt Readings from Last 3 Encounters:  02/05/20 110 lb 12.8 oz (50.3 kg)  01/09/19 103 lb (46.7 kg)  01/20/17 94 lb 12.8 oz (43 kg)     ASSESSMENT AND PLAN:  Discussed the following assessment and plan:  Hypothyroidism, unspecified type - Plan: TSH, T4, free, Basic metabolic panel, Hemoglobin A1c  Medication management - Plan: TSH, T4, free, Basic metabolic panel, Hemoglobin A1c  Anxiety disorder, unspecified type - Continue Paxil  Elevated blood sugar - Plan: TSH, T4, free, Basic metabolic panel, Hemoglobin A1c  Family history of breast cancer in mother - In 61s report some suggestion of beginning mammography screening earlier than typical age.  Counseled about COVID-19 virus infection Anxiety seems to be controlled same medication. Hypothyroid she has been off medication for a while mom has hypothyroidism numbers are off again would add back levothyroxine Discussed how to take medicine and not to worry if a given day is taken incorrectly but is to continue. This may help her constipation and are concerned about mid abdomen weight gain. There is a family history of diabetes and her last blood sugar was up but was not fasting. Plan fasting lab work in 2 to 3 months to include thyroid blood sugar and A1c. Follow-up depending She is up-to-date on Pap declines flu because she never gets it Currently  declining Covid vaccine for a number of reasons discussed today may also be helpful for protection of family members who have risk that although there is breakthrough infection.  The length of contagion is less. Please reconsider  -Patient advised to return or notify health care team  if  new concerns arise.  Patient Instructions  Restart levothyroxine every day and hour before or 2 hours after any other food or medicine. Mornings  usually work best. Does not have to be the exact same time.  Plan fasting lab work which means 8 to 10 hours of nothing but water black coffee black tea We will get sugar thyroid levels. No office visit needed if you are doing well. Or we can do a virtual visit if needed.  Otherwise yearly check.  Reconsider getting Covid vaccine for the reasons discussed.  Get information about mom's history and recommendations for breast cancer screening. Question if genetic testing is needed.   Neta Mends. Taylor Swanson M.D.

## 2020-02-04 NOTE — Progress Notes (Signed)
Thyroid is still off  blood sugar up slightly  we need to discuss at upcoming  visit to discuss medication.

## 2020-02-05 ENCOUNTER — Encounter: Payer: Self-pay | Admitting: Internal Medicine

## 2020-02-05 ENCOUNTER — Other Ambulatory Visit: Payer: Self-pay

## 2020-02-05 ENCOUNTER — Ambulatory Visit (INDEPENDENT_AMBULATORY_CARE_PROVIDER_SITE_OTHER): Payer: 59 | Admitting: Internal Medicine

## 2020-02-05 VITALS — BP 110/80 | HR 82 | Temp 98.4°F | Ht 64.0 in | Wt 110.8 lb

## 2020-02-05 DIAGNOSIS — R739 Hyperglycemia, unspecified: Secondary | ICD-10-CM | POA: Diagnosis not present

## 2020-02-05 DIAGNOSIS — E039 Hypothyroidism, unspecified: Secondary | ICD-10-CM | POA: Diagnosis not present

## 2020-02-05 DIAGNOSIS — Z803 Family history of malignant neoplasm of breast: Secondary | ICD-10-CM

## 2020-02-05 DIAGNOSIS — Z7189 Other specified counseling: Secondary | ICD-10-CM

## 2020-02-05 DIAGNOSIS — F419 Anxiety disorder, unspecified: Secondary | ICD-10-CM

## 2020-02-05 DIAGNOSIS — Z79899 Other long term (current) drug therapy: Secondary | ICD-10-CM | POA: Diagnosis not present

## 2020-02-05 MED ORDER — LEVOTHYROXINE SODIUM 50 MCG PO TABS: 50.0000 ug | ORAL_TABLET | Freq: Every day | ORAL | 1 refills | Status: DC

## 2020-02-05 NOTE — Patient Instructions (Addendum)
Restart levothyroxine every day and hour before or 2 hours after any other food or medicine. Mornings usually work best. Does not have to be the exact same time.  Plan fasting lab work which means 8 to 10 hours of nothing but water black coffee black tea We will get sugar thyroid levels. No office visit needed if you are doing well. Or we can do a virtual visit if needed.  Otherwise yearly check.  Reconsider getting Covid vaccine for the reasons discussed.  Get information about mom's history and recommendations for breast cancer screening. Question if genetic testing is needed.

## 2020-02-24 ENCOUNTER — Other Ambulatory Visit: Payer: Self-pay | Admitting: Internal Medicine

## 2020-02-26 ENCOUNTER — Other Ambulatory Visit: Payer: Self-pay

## 2020-02-26 MED ORDER — DROSPIRENONE-ETHINYL ESTRADIOL 3-0.03 MG PO TABS: 1.0000 | ORAL_TABLET | Freq: Every day | ORAL | 2 refills | Status: DC

## 2020-02-26 NOTE — Telephone Encounter (Signed)
Agree with refill of OCP medicine If she is having fever progressive headache advise evaluation

## 2020-02-27 NOTE — Telephone Encounter (Signed)
noted 

## 2020-03-09 ENCOUNTER — Emergency Department (HOSPITAL_COMMUNITY)
Admission: EM | Admit: 2020-03-09 | Discharge: 2020-03-09 | Disposition: A | Discharge status: Home / Self Care | Payer: 59 | Attending: Emergency Medicine | Admitting: Emergency Medicine

## 2020-03-09 DIAGNOSIS — R112 Nausea with vomiting, unspecified: Secondary | ICD-10-CM | POA: Insufficient documentation

## 2020-03-09 DIAGNOSIS — R110 Nausea: Secondary | ICD-10-CM

## 2020-03-09 DIAGNOSIS — Z79899 Other long term (current) drug therapy: Secondary | ICD-10-CM | POA: Insufficient documentation

## 2020-03-09 DIAGNOSIS — E039 Hypothyroidism, unspecified: Secondary | ICD-10-CM | POA: Insufficient documentation

## 2020-03-09 DIAGNOSIS — R197 Diarrhea, unspecified: Secondary | ICD-10-CM | POA: Insufficient documentation

## 2020-03-09 LAB — CBC
HCT: 40.6 % (ref 36.0–46.0)
Hemoglobin: 13.4 g/dL (ref 12.0–15.0)
MCH: 29.5 pg (ref 26.0–34.0)
MCHC: 33 g/dL (ref 30.0–36.0)
MCV: 89.4 fL (ref 80.0–100.0)
Platelets: 277 10*3/uL (ref 150–400)
RBC: 4.54 MIL/uL (ref 3.87–5.11)
RDW: 12.2 % (ref 11.5–15.5)
WBC: 12.9 10*3/uL — ABNORMAL HIGH (ref 4.0–10.5)
nRBC: 0 % (ref 0.0–0.2)

## 2020-03-09 LAB — COMPREHENSIVE METABOLIC PANEL
ALT: 18 U/L (ref 0–44)
AST: 22 U/L (ref 15–41)
Albumin: 4.2 g/dL (ref 3.5–5.0)
Alkaline Phosphatase: 50 U/L (ref 38–126)
Anion gap: 7 (ref 5–15)
BUN: 10 mg/dL (ref 6–20)
CO2: 23 mmol/L (ref 22–32)
Calcium: 9.1 mg/dL (ref 8.9–10.3)
Chloride: 104 mmol/L (ref 98–111)
Creatinine, Ser: 0.61 mg/dL (ref 0.44–1.00)
GFR, Estimated: >60 mL/min (ref >60)
Glucose, Bld: 106 mg/dL — ABNORMAL HIGH (ref 70–99)
Potassium: 3.6 mmol/L (ref 3.5–5.1)
Sodium: 134 mmol/L — ABNORMAL LOW (ref 135–145)
Total Bilirubin: 0.7 mg/dL (ref 0.3–1.2)
Total Protein: 7.9 g/dL (ref 6.5–8.1)

## 2020-03-09 LAB — I-STAT BETA HCG BLOOD, ED (MC, WL, AP ONLY): I-stat hCG, quantitative: <5 m[IU]/mL (ref <5)

## 2020-03-09 LAB — LIPASE, BLOOD: Lipase: 25 U/L (ref 11–51)

## 2020-03-09 MED ORDER — ONDANSETRON 4 MG PO TBDP
4.0000 mg | ORAL_TABLET | Freq: Three times a day (TID) | ORAL | 0 refills | Status: DC | PRN
Start: 2020-03-09 — End: 2020-08-06

## 2020-03-09 MED ORDER — ONDANSETRON 4 MG PO TBDP
4.0000 mg | ORAL_TABLET | Freq: Once | ORAL | Status: AC | PRN
  Administered 2020-03-09: 4 mg via ORAL
  Filled 2020-03-09: qty 1

## 2020-03-09 NOTE — ED Provider Notes (Signed)
[facility]-EMERGENCY DEPT Provider Note   CSN: [id number] Arrival date & time: 03/09/20  0300     History Chief Complaint  Patient presents with  . Nausea    Taylor Swanson is a 35 y.o. female.  The history is provided by the patient and medical records.    36 year old female with history of anxiety, hypothyroidism, self-mutilation, presenting to the ED with nausea.  States she has felt nauseated pretty much all day and had one episode of emesis around midnight and another around 2 AM.  States she did have a little bit of loose stool earlier today.  She also missed 3 of her birth control pills this past cycle and was concerned for pregnancy.  She denies any pelvic pain or vaginal discharge.  States her last cycle was approximately 30 days ago and her cycles are usually fairly regular.  Given Zofran in triage and states she feels better now.  Past Medical History:  Diagnosis Date  . Anxiety   . Hypothyroid   . Self mutilation   . SELF MUTILATION 03/08/2007   Qualifier: History of  By: Fabian Sharp MD, Neta Mends     Patient Active Problem List   Diagnosis Date Noted  . Anxiety disorder 02/13/2014  . Underweight 08/03/2012  . Irregular menses 12/15/2010  . Low body weight 12/15/2010  . Injury of shoulder 07/31/2010  . Joint pain in the shoulder/clavicle region 07/28/2010  . GERD 08/16/2009  . Irritable bowel syndrome 08/16/2009  . Hypothyroidism 05/09/2009  . PANIC ATTACK 07/02/2008  . DISRUPTION 24 HOUR SLEEP WAKE CYCLE UNSPECIFIED 09/07/2007  . 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 ANXIETY 05/18/2006    Past Surgical History:  Procedure Laterality Date  . FRACTURE SURGERY     RIGHT ARM     OB History   No obstetric history on file.     Family History  Problem Relation Age of Onset  . Thyroid disease Mother   . Acne Father     Social History   Tobacco Use  . Smoking status: Never Smoker  . Smokeless tobacco: Never Used  Vaping Use  . Vaping  Use: Never used  Substance Use Topics  . Alcohol use: Yes    Comment: occ  . Drug use: No    Home Medications Prior to Admission medications   Medication Sig Start Date End Date Taking? Authorizing Provider  drospirenone-ethinyl estradiol (YASMIN) 3-0.03 MG tablet Take 1 tablet by mouth daily. 02/26/20   Panosh, Neta Mends, MD  ibuprofen (ADVIL,MOTRIN) 600 MG tablet Take 1 tablet (600 mg total) by mouth every 8 (eight) hours as needed for moderate pain. Patient not taking: Reported on 02/05/2020 10/29/15   Panosh, Neta Mends, MD  levothyroxine (SYNTHROID) 50 MCG tablet Take 1 tablet (50 mcg total) by mouth daily. 02/05/20   Panosh, Neta Mends, MD  ondansetron (ZOFRAN) 4 MG tablet Take 1 tablet (4 mg total) by mouth every 8 (eight) hours as needed. Patient not taking: Reported on 02/05/2020 10/07/15   Madelin Headings, MD  PARoxetine (PAXIL) 20 MG tablet Take 1 tablet (20 mg total) by mouth daily. 01/30/20   Panosh, Neta Mends, MD    Allergies    Iron  Review of Systems   Review of Systems  Gastrointestinal: Positive for nausea and vomiting.  All other systems reviewed and are negative.   Physical Exam Updated Vital Signs BP 112/68   Pulse 77   Temp 97.9 F (36.6 C) (Oral)   Resp  16   Ht 5\' 4"  (1.626 m)   Wt 49.9 kg   LMP 02/10/2020   SpO2 100%   BMI 18.88 kg/m   Physical Exam Vitals and nursing note reviewed.  Constitutional:      Appearance: She is well-developed and well-nourished.  HENT:     Head: Normocephalic and atraumatic.     Mouth/Throat:     Mouth: Oropharynx is clear and moist.  Eyes:     Extraocular Movements: EOM normal.     Conjunctiva/sclera: Conjunctivae normal.     Pupils: Pupils are equal, round, and reactive to light.  Cardiovascular:     Rate and Rhythm: Normal rate and regular rhythm.     Heart sounds: Normal heart sounds.  Pulmonary:     Effort: Pulmonary effort is normal.     Breath sounds: Normal breath sounds.  Abdominal:     General: Bowel sounds  are normal.     Palpations: Abdomen is soft.     Comments: Soft, nontender  Musculoskeletal:        General: Normal range of motion.     Cervical back: Normal range of motion.  Skin:    General: Skin is warm and dry.  Neurological:     Mental Status: She is alert and oriented to person, place, and time.  Psychiatric:        Mood and Affect: Mood and affect normal.     ED Results / Procedures / Treatments   Labs (all labs ordered are listed, but only abnormal results are displayed) Labs Reviewed  COMPREHENSIVE METABOLIC PANEL - Abnormal; Notable for the following components:      Result Value   Sodium 134 (*)    Glucose, Bld 106 (*)    All other components within normal limits  CBC - Abnormal; Notable for the following components:   WBC 12.9 (*)    All other components within normal limits  LIPASE, BLOOD  I-STAT BETA HCG BLOOD, ED ([facility], [facility], [facility] ONLY)    EKG None  Radiology No results found.  Procedures Procedures   Medications Ordered in ED Medications  ondansetron (ZOFRAN-ODT) disintegrating tablet 4 mg (4 mg Oral Given 03/09/20 05/09/20)    ED Course  I have reviewed the triage vital signs and the nursing notes.  Pertinent labs & imaging results that were available during my care of the patient were reviewed by me and considered in my medical decision making (see chart for details).    MDM Rules/Calculators/A&P  35 year old female here with nausea and 2 episodes of emesis prior to arrival.  States she been nauseated all day.  Did have a little bit of loose stool, but also reports missing 3 of her birth control pills in the last cycle.  Concern for possible pregnancy.  She is afebrile nontoxic.  Her abdomen is soft and nontender.  No complaints of abdominal pain.  Her labs are reassuring.  Pregnancy test is negative.  Suspect this is likely viral process.  She was given Zofran here with improvement of symptoms.  Tolerated small amount of gingerale but did not want to  drink anymore or try crackers.  Feel she is stable for discharge with continued symptomatic care.  Close follow-up with PCP.  Return here for new concerns.  Final Clinical Impression(s) / ED Diagnoses Final diagnoses:  Nausea    Rx / DC Orders ED Discharge Orders         Ordered    ondansetron (ZOFRAN ODT) 4 MG disintegrating  tablet  Every 8 hours PRN        03/09/20 0513           Garlon Hatchet, PA-C 03/09/20 1157    Gilda Crease, MD 03/09/20 [phone]

## 2020-03-09 NOTE — ED Triage Notes (Addendum)
Pt via ems reports nausea since midnight. x1 episode since then. Pt reports being on birth control but missed last two doses. Next menstrual cycle is due in 3 days. Possible pregnancy.

## 2020-03-09 NOTE — Discharge Instructions (Signed)
Pregnancy test was negative, labs were normal. Take the prescribed medication as directed.  Gentle diet for now, progress as tolerated. Follow-up with your primary care doctor. Return to the ED for new or worsening symptoms.

## 2020-03-09 NOTE — ED Notes (Signed)
Agree with triage note. Vitals taken. A&Ox4. Respirations regular/unlabored. Connected to BP, pulse ox. Stretcher low, wheels locked, call bell within reach.

## 2020-03-22 ENCOUNTER — Emergency Department (HOSPITAL_COMMUNITY)
Admission: EM | Admit: 2020-03-22 | Discharge: 2020-03-22 | Disposition: A | Discharge status: Home / Self Care | Payer: 59 | Attending: Emergency Medicine | Admitting: Emergency Medicine

## 2020-03-22 ENCOUNTER — Encounter (HOSPITAL_COMMUNITY): Payer: Self-pay | Admitting: Emergency Medicine

## 2020-03-22 ENCOUNTER — Other Ambulatory Visit: Payer: Self-pay

## 2020-03-22 DIAGNOSIS — N946 Dysmenorrhea, unspecified: Secondary | ICD-10-CM | POA: Insufficient documentation

## 2020-03-22 DIAGNOSIS — Z79899 Other long term (current) drug therapy: Secondary | ICD-10-CM | POA: Diagnosis not present

## 2020-03-22 DIAGNOSIS — R102 Pelvic and perineal pain: Secondary | ICD-10-CM | POA: Diagnosis present

## 2020-03-22 DIAGNOSIS — E039 Hypothyroidism, unspecified: Secondary | ICD-10-CM | POA: Diagnosis not present

## 2020-03-22 LAB — COMPREHENSIVE METABOLIC PANEL
ALT: 19 U/L (ref 0–44)
AST: 22 U/L (ref 15–41)
Albumin: 4.4 g/dL (ref 3.5–5.0)
Alkaline Phosphatase: 53 U/L (ref 38–126)
Anion gap: 11 (ref 5–15)
BUN: 11 mg/dL (ref 6–20)
CO2: 21 mmol/L — ABNORMAL LOW (ref 22–32)
Calcium: 9.4 mg/dL (ref 8.9–10.3)
Chloride: 105 mmol/L (ref 98–111)
Creatinine, Ser: 0.84 mg/dL (ref 0.44–1.00)
GFR, Estimated: >60 mL/min (ref >60)
Glucose, Bld: 90 mg/dL (ref 70–99)
Potassium: 4.2 mmol/L (ref 3.5–5.1)
Sodium: 137 mmol/L (ref 135–145)
Total Bilirubin: 0.7 mg/dL (ref 0.3–1.2)
Total Protein: 8.4 g/dL — ABNORMAL HIGH (ref 6.5–8.1)

## 2020-03-22 LAB — CBC
HCT: 40.8 % (ref 36.0–46.0)
Hemoglobin: 13.1 g/dL (ref 12.0–15.0)
MCH: 29.2 pg (ref 26.0–34.0)
MCHC: 32.1 g/dL (ref 30.0–36.0)
MCV: 91.1 fL (ref 80.0–100.0)
Platelets: 318 10*3/uL (ref 150–400)
RBC: 4.48 MIL/uL (ref 3.87–5.11)
RDW: 12.6 % (ref 11.5–15.5)
WBC: 8 10*3/uL (ref 4.0–10.5)
nRBC: 0 % (ref 0.0–0.2)

## 2020-03-22 LAB — LIPASE, BLOOD: Lipase: 24 U/L (ref 11–51)

## 2020-03-22 LAB — I-STAT BETA HCG BLOOD, ED (MC, WL, AP ONLY): I-stat hCG, quantitative: <5 m[IU]/mL (ref <5)

## 2020-03-22 LAB — WET PREP, GENITAL
Clue Cells Wet Prep HPF POC: NONE SEEN
Sperm: NONE SEEN
Trich, Wet Prep: NONE SEEN
Yeast Wet Prep HPF POC: NONE SEEN

## 2020-03-22 MED ORDER — ONDANSETRON 4 MG PO TBDP
4.0000 mg | ORAL_TABLET | Freq: Once | ORAL | Status: AC
  Administered 2020-03-22: 4 mg via ORAL
  Filled 2020-03-22: qty 1

## 2020-03-22 MED ORDER — IBUPROFEN 800 MG PO TABS
800.0000 mg | ORAL_TABLET | Freq: Once | ORAL | Status: AC
  Administered 2020-03-22: 800 mg via ORAL
  Filled 2020-03-22: qty 1

## 2020-03-22 NOTE — ED Triage Notes (Signed)
Patient complains complains of abdominal pain and nausea since 3/5, states she was seen here and it got better but now her pain and nausea is back. Denies fevers.

## 2020-03-22 NOTE — Discharge Instructions (Addendum)
You were seen in the emergency department today for your lower abdominal cramping and vaginal bleeding.  You were found to be on your period.  Your blood work and physical exam were very reassuring.  You may take over-the-counter ibuprofen and/or Tylenol as needed for your pain.  Additionally may apply heating pad or hot water bottle or hot towel to your lower abdomen you are experiencing cramping.  I would recommend you follow-up with an OB/GYN's provider to establish care and manage your contraceptive pills.  Regarding your constipation recommend you try over-the-counter MiraLAX daily and follow-up with your primary care doctor.  Return to the emergency department develop any worsening abdominal pain, nausea vomiting does not stop, extremely heavy vaginal bleeding, or any other new severe symptoms.

## 2020-03-22 NOTE — ED Provider Notes (Signed)
[facility]-EMERGENCY DEPT Provider Note   CSN: [id number] Arrival date & time: 03/22/20  7425     History Chief Complaint  Patient presents with  . Abdominal Pain  . Nausea    Taylor Swanson is a 35 y.o. female who presents with concern for 3 days of intermittent small-volume liquid diarrhea with associated pelvic cramping and vaginal bleeding.  Patient believes she is on her period, states she is supposed to be at this time.  She has recently started OCPs, approximately 3 months ago and has been experiencing nausea intermittent abdominal pain as side effects of her new medication.  She denies any dysuria, hematuria, urinary frequency or urgency.  She does endorse vaginal pain with sex but denies vaginal pain at rest.  Denies any vaginal discharge but endorses body dark red to brown vaginal bleeding intermittently over the last 3 days with associated lower abdominal cramping.  Cramping was initially relieved by Midol, but she stopped taking it as it was no longer relieving her and caffeine was keeping her awake at night.  She has not tried any other medications at home to help with cramping.  She is in a monogamous relationship with a female partner.  Denies history of STI.  I personally reviewed this patient's medical records.  She has history of hypothyroidism, anxiety and panic attacks on Paxil.  HPI     Past Medical History:  Diagnosis Date  . Anxiety   . Hypothyroid   . Self mutilation   . SELF MUTILATION 03/08/2007   Qualifier: History of  By: Fabian Sharp MD, Neta Mends     Patient Active Problem List   Diagnosis Date Noted  . Anxiety disorder 02/13/2014  . Underweight 08/03/2012  . Irregular menses 12/15/2010  . Low body weight 12/15/2010  . Injury of shoulder 07/31/2010  . Joint pain in the shoulder/clavicle region 07/28/2010  . GERD 08/16/2009  . Irritable bowel syndrome 08/16/2009  . Hypothyroidism 05/09/2009  . PANIC ATTACK 07/02/2008  . DISRUPTION  24 HOUR SLEEP WAKE CYCLE UNSPECIFIED 09/07/2007  . 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 ANXIETY 05/18/2006    Past Surgical History:  Procedure Laterality Date  . FRACTURE SURGERY     RIGHT ARM     OB History   No obstetric history on file.     Family History  Problem Relation Age of Onset  . Thyroid disease Mother   . Acne Father     Social History   Tobacco Use  . Smoking status: Never Smoker  . Smokeless tobacco: Never Used  Vaping Use  . Vaping Use: Never used  Substance Use Topics  . Alcohol use: Yes    Comment: occ  . Drug use: No    Home Medications Prior to Admission medications   Medication Sig Start Date End Date Taking? Authorizing Provider  levothyroxine (SYNTHROID) 50 MCG tablet Take 1 tablet (50 mcg total) by mouth daily. 02/05/20   Panosh, Neta Mends, MD  ondansetron (ZOFRAN ODT) 4 MG disintegrating tablet Take 1 tablet (4 mg total) by mouth every 8 (eight) hours as needed for nausea. 03/09/20   Garlon Hatchet, PA-C  PARoxetine (PAXIL) 20 MG tablet Take 1 tablet (20 mg total) by mouth daily. 01/30/20   Panosh, Neta Mends, MD    Allergies    Iron  Review of Systems   Review of Systems  Constitutional: Negative.   HENT: Negative.   Respiratory: Negative.   Cardiovascular: Negative.   Gastrointestinal: Positive for  abdominal pain, diarrhea and nausea. Negative for constipation, rectal pain and vomiting.  Genitourinary: Positive for dyspareunia, menstrual problem, pelvic pain and vaginal bleeding. Negative for decreased urine volume, difficulty urinating, dysuria, enuresis, flank pain, frequency, genital sores, hematuria, urgency, vaginal discharge and vaginal pain.  Musculoskeletal: Negative.   Skin: Negative.     Physical Exam Updated Vital Signs BP 122/72   Pulse 81   Temp (!) 97.5 F (36.4 C) (Oral)   Resp 18   Ht 5\' 4"  (1.626 m)   Wt 49.9 kg   SpO2 100%   BMI 18.88 kg/m   Physical Exam Vitals and nursing note reviewed. Exam conducted with  a chaperone present.  Constitutional:      Appearance: She is normal weight.  HENT:     Head: Normocephalic and atraumatic.     Nose: Nose normal.     Mouth/Throat:     Mouth: Mucous membranes are moist.     Pharynx: Oropharynx is clear. Uvula midline. No oropharyngeal exudate or posterior oropharyngeal erythema.     Tonsils: No tonsillar exudate.  Eyes:     General: Lids are normal. Vision grossly intact.        Right eye: No discharge.        Left eye: No discharge.     Extraocular Movements: Extraocular movements intact.     Conjunctiva/sclera: Conjunctivae normal.     Pupils: Pupils are equal, round, and reactive to light.  Neck:     Trachea: Trachea and phonation normal.  Cardiovascular:     Rate and Rhythm: Normal rate and regular rhythm.     Pulses: Normal pulses.     Heart sounds: Normal heart sounds. No murmur heard.   Pulmonary:     Effort: Pulmonary effort is normal. No respiratory distress.     Breath sounds: Normal breath sounds. No wheezing or rales.  Chest:     Chest wall: No deformity, swelling, tenderness or crepitus.  Abdominal:     General: Bowel sounds are normal. There is no distension.     Palpations: Abdomen is soft.     Tenderness: There is no abdominal tenderness. There is no right CVA tenderness or left CVA tenderness.  Genitourinary:    General: Normal vulva.     Exam position: Lithotomy position.     Vagina: No signs of injury and foreign body. Bleeding present. No vaginal discharge or tenderness.     Cervix: No cervical motion tenderness or friability.     Uterus: Normal.      Adnexa: Right adnexa normal and left adnexa normal.       Right: No tenderness or fullness.         Left: No tenderness or fullness.       Rectum: External hemorrhoid present.       Comments: Moderate blood in the vaginal vault without injury, friability or lesion. No dilation of the cervical os or CMT. No andexal fullness or TTP.  Musculoskeletal:        General: No  deformity.     Cervical back: Normal range of motion and neck supple. No crepitus. No pain with movement, spinous process tenderness or muscular tenderness.     Right lower leg: No edema.     Left lower leg: No edema.  Skin:    General: Skin is warm and dry.     Capillary Refill: Capillary refill takes less than 2 seconds.  Neurological:     General: No focal deficit present.  Mental Status: She is alert and oriented to person, place, and time. Mental status is at baseline.  Psychiatric:        Mood and Affect: Mood normal.     ED Results / Procedures / Treatments   Labs (all labs ordered are listed, but only abnormal results are displayed) Labs Reviewed  WET PREP, GENITAL - Abnormal; Notable for the following components:      Result Value   WBC, Wet Prep HPF POC MODERATE (*)    All other components within normal limits  COMPREHENSIVE METABOLIC PANEL - Abnormal; Notable for the following components:   CO2 21 (*)    Total Protein 8.4 (*)    All other components within normal limits  LIPASE, BLOOD  CBC  URINALYSIS, ROUTINE W REFLEX MICROSCOPIC  I-STAT BETA HCG BLOOD, ED ([facility], [facility], [facility] ONLY)  GC/CHLAMYDIA PROBE AMP ([place]) NOT AT [facility]    EKG None  Radiology No results found.  Procedures Procedures  Medications Ordered in ED Medications  ondansetron (ZOFRAN-ODT) disintegrating tablet 4 mg (4 mg Oral Given 03/22/20 1407)  ibuprofen (ADVIL) tablet 800 mg (800 mg Oral Given 03/22/20 1407)    ED Course  I have reviewed the triage vital signs and the nursing notes.  Pertinent labs & imaging results that were available during my care of the patient were reviewed by me and considered in my medical decision making (see chart for details).    MDM Rules/Calculators/A&P                         35 year old female presents with concern for lower abdominal cramping intermittently with associated vaginal bleeding and loose stool for the past couple of days.  She states she  is supposed to be on her periods.  Vital signs are normal on intake.  Cardiopulmonary exam is normal, abdominal exam is benign.  GU exam revealed small external hemorrhoid at the 6 o'clock position, very small perineal abrasion likely secondary to itching of this hemorrhoid, per patient, and moderate amount of vaginal bleeding without CMT or adnexal fullness/TTP.  Do not feel pelvic ultrasound is required at this time.  Basic laboratory studies obtained in triage, CBC unremarkable, CMP unremarkable, lipase normal, patient is not pregnant.  Patient unable to provide urine specimen.  Wet mount in process, GC/chlamydia collected.  Zofran and ibuprofen offered.  Wet prep negative.  No indication for presumptive antibiotic treatment for GC/chlamydia for this patient.  Suspect patient symptoms are secondary to new OCPs and current menstrual cramping as she is on her period.  I encouraged her to establish care with an OB/GYN, she may follow-up with her PCP in the interim.  [facility] voiced understanding of her medical evaluation and treatment plan.  Each of her questions was answered to her expressed satisfaction.  Return precautions given.  Patient is well-appearing, stable, and appropriate for discharge at this time.  This chart was dictated using voice recognition software, Dragon. Despite the best efforts of this provider to proofread and correct errors, errors may still occur which can change documentation meaning.  Final Clinical Impression(s) / ED Diagnoses Final diagnoses:  Menstrual pain    Rx / DC Orders ED Discharge Orders    None       Sherrilee Gilles 03/22/20 1455    Wynetta Fines, MD 03/23/20 2154

## 2020-03-25 LAB — GC/CHLAMYDIA PROBE AMP (~~LOC~~) NOT AT ARMC
Chlamydia: NEGATIVE
Comment: NEGATIVE
Comment: NORMAL
Neisseria Gonorrhea: NEGATIVE

## 2020-05-26 ENCOUNTER — Other Ambulatory Visit: Payer: Self-pay | Admitting: Internal Medicine

## 2020-05-27 ENCOUNTER — Other Ambulatory Visit: Payer: Self-pay

## 2020-05-27 NOTE — Telephone Encounter (Signed)
Last OV: 02/05/2020 Last Refill: 04/19/2020 Future OV: None scheduled

## 2020-06-07 ENCOUNTER — Other Ambulatory Visit: Payer: Self-pay

## 2020-06-10 ENCOUNTER — Encounter: Payer: Self-pay | Admitting: Family Medicine

## 2020-06-10 ENCOUNTER — Ambulatory Visit (INDEPENDENT_AMBULATORY_CARE_PROVIDER_SITE_OTHER): Payer: 59 | Admitting: Family Medicine

## 2020-06-10 ENCOUNTER — Other Ambulatory Visit: Payer: Self-pay

## 2020-06-10 VITALS — BP 102/78 | HR 87 | Temp 98.0°F | Wt 108.2 lb

## 2020-06-10 DIAGNOSIS — K649 Unspecified hemorrhoids: Secondary | ICD-10-CM

## 2020-06-10 DIAGNOSIS — K581 Irritable bowel syndrome with constipation: Secondary | ICD-10-CM

## 2020-06-10 MED ORDER — HYDROCORTISONE ACETATE 25 MG RE SUPP: 25.0000 mg | Freq: Two times a day (BID) | RECTAL | 0 refills | Status: DC

## 2020-06-10 NOTE — Patient Instructions (Signed)
Irritable Bowel Syndrome, Adult  Irritable bowel syndrome (IBS) is a group of symptoms that affects the organs responsible for digestion (gastrointestinal or GI tract). IBS is not one specific disease. To regulate how the GI tract works, the body sends signals back and forth between the intestines and the brain. If you have IBS, there may be a problem with these signals. As a result, the GI tract does not function normally. The intestines may become more sensitive and overreact to certain things. This may be especially true when you eat certain foods or when you are under stress. There are four types of IBS. These may be determined based on the consistency of your stool (feces):  IBS with diarrhea.  IBS with constipation.  Mixed IBS.  Unsubtyped IBS. It is important to know which type of IBS you have. Certain treatments are more likely to be helpful for certain types of IBS. What are the causes? The exact cause of IBS is not known. What increases the risk? You may have a higher risk for IBS if you:  Are female.  Are younger than 40.  Have a family history of IBS.  Have a mental health condition, such as depression, anxiety, or post-traumatic stress disorder.  Have had a bacterial infection of your GI tract. What are the signs or symptoms? Symptoms of IBS vary from person to person. The main symptom is abdominal pain or discomfort. Other symptoms usually include one or more of the following:  Diarrhea, constipation, or both.  Abdominal swelling or bloating.  Feeling full after eating a small or regular-sized meal.  Frequent gas.  Mucus in the stool.  A feeling of having more stool left after a bowel movement. Symptoms tend to come and go. They may be triggered by stress, mental health conditions, or certain foods. How is this diagnosed? This condition may be diagnosed based on a physical exam, your medical history, and your symptoms. You may have tests, such as:  Blood  tests.  Stool test.  X-rays.  CT scan.  Colonoscopy. This is a procedure in which your GI tract is viewed with a long, thin, flexible tube. How is this treated? There is no cure for IBS, but treatment can help relieve symptoms. Treatment depends on the type of IBS you have, and may include:  Changes to your diet, such as: ? Avoiding foods that cause symptoms. ? Drinking more water. ? Following a low-FODMAP (fermentable oligosaccharides, disaccharides, monosaccharides, and polyols) diet for up to 6 weeks, or as told by your health care provider. FODMAPs are sugars that are hard for some people to digest. ? Eating more fiber. ? Eating medium-sized meals at the same times every day.  Medicines. These may include: ? Fiber supplements, if you have constipation. ? Medicine to control diarrhea (antidiarrheal medicines). ? Medicine to help control muscle tightening (spasms) in your GI tract (antispasmodic medicines). ? Medicines to help with mental health conditions, such as antidepressants or tranquilizers.  Talk therapy or counseling.  Working with a diet and nutrition specialist (dietitian) to help create a food plan that is right for you.  Managing your stress. Follow these instructions at home: Eating and drinking  Eat a healthy diet.  Eat medium-sized meals at about the same time every day. Do not eat large meals.  Gradually eat more fiber-rich foods. These include whole grains, fruits, and vegetables. This may be especially helpful if you have IBS with constipation.  Eat a diet low in FODMAPs.  Drink enough   fluid to keep your urine pale yellow.  Keep a journal of foods that seem to trigger symptoms.  Avoid foods and drinks that: ? Contain added sugar. ? Make your symptoms worse. Dairy products, caffeinated drinks, and carbonated drinks can make symptoms worse for some people. General instructions  Take over-the-counter and prescription medicines and supplements only  as told by your health care provider.  Get enough exercise. Do at least 150 minutes of moderate-intensity exercise each week.  Manage your stress. Getting enough sleep and exercise can help you manage stress.  Keep all follow-up visits as told by your health care provider and therapist. This is important. Alcohol Use  Do not drink alcohol if: ? Your health care provider tells you not to drink. ? You are pregnant, may be pregnant, or are planning to become pregnant.  If you drink alcohol, limit how much you have: ? 0-1 drink a day for women. ? 0-2 drinks a day for men.  Be aware of how much alcohol is in your drink. In the [place] one drink equals one typical bottle of beer (12 oz), one-half glass of wine (5 oz), or one shot of hard liquor (1 oz). Contact a health care provider if you have:  Constant pain.  Weight loss.  Difficulty or pain when swallowing.  Diarrhea that gets worse. Get help right away if you have:  Severe abdominal pain.  Fever.  Diarrhea with symptoms of dehydration, such as dizziness or dry mouth.  Bright red blood in your stool.  Stool that is black and tarry.  Abdominal swelling.  Vomiting that does not stop.  Blood in your vomit. Summary  Irritable bowel syndrome (IBS) is not one specific disease. It is a group of symptoms that affects digestion.  Your intestines may become more sensitive and overreact to certain things. This may be especially true when you eat certain foods or when you are under stress.  There is no cure for IBS, but treatment can help relieve symptoms. This information is not intended to replace advice given to you by your health care provider. Make sure you discuss any questions you have with your health care provider. Document Revised: 08/24/2019 Document Reviewed: 08/24/2019 Elsevier Patient Education  2021 [employer]  Hemorrhoids Hemorrhoids are swollen veins that may develop:  In the butt (rectum). These are  called internal hemorrhoids.  Around the opening of the butt (anus). These are called external hemorrhoids. Hemorrhoids can cause pain, itching, or bleeding. Most of the time, they do not cause serious problems. They usually get better with diet changes, lifestyle changes, and other home treatments. What are the causes? This condition may be caused by:  Having trouble pooping (constipation).  Pushing hard (straining) to poop.  Watery poop (diarrhea).  Pregnancy.  Being very overweight (obese).  Sitting for long periods of time.  Heavy lifting or other activity that causes you to strain.  Anal sex.  Riding a bike for a long period of time. What are the signs or symptoms? Symptoms of this condition include:  Pain.  Itching or soreness in the butt.  Bleeding from the butt.  Leaking poop.  Swelling in the area.  One or more lumps around the opening of your butt. How is this diagnosed? A doctor can often diagnose this condition by looking at the affected area. The doctor may also:  Do an exam that involves feeling the area with a gloved hand (digital rectal exam).  Examine the area inside your butt using  a small tube (anoscope).  Order blood tests. This may be done if you have lost a lot of blood.  Have you get a test that involves looking inside the colon using a flexible tube with a camera on the end (sigmoidoscopy or colonoscopy). How is this treated? This condition can usually be treated at home. Your doctor may tell you to change what you eat, make lifestyle changes, or try home treatments. If these do not help, procedures can be done to remove the hemorrhoids or make them smaller. These may involve:  Placing rubber bands at the base of the hemorrhoids to cut off their blood supply.  Injecting medicine into the hemorrhoids to shrink them.  Shining a type of light energy onto the hemorrhoids to cause them to fall off.  Doing surgery to remove the hemorrhoids or  cut off their blood supply. Follow these instructions at home: Eating and drinking  Eat foods that have a lot of fiber in them. These include whole grains, beans, nuts, fruits, and vegetables.  Ask your doctor about taking products that have added fiber (fibersupplements).  Reduce the amount of fat in your diet. You can do this by: ? Eating low-fat dairy products. ? Eating less red meat. ? Avoiding processed foods.  Drink enough fluid to keep your pee (urine) pale yellow.   Managing pain and swelling  Take a warm-water bath (sitz bath) for 20 minutes to ease pain. Do this 3-4 times a day. You may do this in a bathtub or using a portable sitz bath that fits over the toilet.  If told, put ice on the painful area. It may be helpful to use ice between your warm baths. ? Put ice in a plastic bag. ? Place a towel between your skin and the bag. ? Leave the ice on for 20 minutes, 2-3 times a day.   General instructions  Take over-the-counter and prescription medicines only as told by your doctor. ? Medicated creams and medicines may be used as told.  Exercise often. Ask your doctor how much and what kind of exercise is best for you.  Go to the bathroom when you have the urge to poop. Do not wait.  Avoid pushing too hard when you poop.  Keep your butt dry and clean. Use wet toilet paper or moist towelettes after pooping.  Do not sit on the toilet for a long time.  Keep all follow-up visits as told by your doctor. This is important. Contact a doctor if you:  Have pain and swelling that do not get better with treatment or medicine.  Have trouble pooping.  Cannot poop.  Have pain or swelling outside the area of the hemorrhoids. Get help right away if you have:  Bleeding that will not stop. Summary  Hemorrhoids are swollen veins in the butt or around the opening of the butt.  They can cause pain, itching, or bleeding.  Eat foods that have a lot of fiber in them. These  include whole grains, beans, nuts, fruits, and vegetables.  Take a warm-water bath (sitz bath) for 20 minutes to ease pain. Do this 3-4 times a day. This information is not intended to replace advice given to you by your health care provider. Make sure you discuss any questions you have with your health care provider. Document Revised: 12/30/2017 Document Reviewed: 05/13/2017 Elsevier Patient Education  2021 [employer] Inc.  Low-FODMAP Eating Plan  FODMAP stands for fermentable oligosaccharides, disaccharides, monosaccharides, and polyols. These are sugars that  are hard for some people to digest. A low-FODMAP eating plan may help some people who have irritable bowel syndrome (IBS) and certain other bowel (intestinal) diseases to manage their symptoms. This meal plan can be complicated to follow. Work with a diet and nutrition specialist (dietitian) to make a low-FODMAP eating plan that is right for you. A dietitian can help make sure that you get enough nutrition from this diet. What are tips for following this plan? Reading food labels  Check labels for hidden FODMAPs such as: ? High-fructose syrup. ? Honey. ? Agave. ? Natural fruit flavors. ? Onion or garlic powder.  Choose low-FODMAP foods that contain 3-4 grams of fiber per serving.  Check food labels for serving sizes. Eat only one serving at a time to make sure FODMAP levels stay low. Shopping  Shop with a list of foods that are recommended on this diet and make a meal plan. Meal planning  Follow a low-FODMAP eating plan for up to 6 weeks, or as told by your health care provider or dietitian.  To follow the eating plan: 1. Eliminate high-FODMAP foods from your diet completely. Choose only low-FODMAP foods to eat. You will do this for 2-6 weeks. 2. Gradually reintroduce high-FODMAP foods into your diet one at a time. Most people should wait a few days before introducing the next new high-FODMAP food into their meal plan. Your  dietitian can recommend how quickly you may reintroduce foods. 3. Keep a daily record of what and how much you eat and drink. Make note of any symptoms that you have after eating. 4. Review your daily record with a dietitian regularly to identify which foods you can eat and which foods you should avoid. General tips  Drink enough fluid each day to keep your urine pale yellow.  Avoid processed foods. These often have added sugar and may be high in FODMAPs.  Avoid most dairy products, whole grains, and sweeteners.  Work with a dietitian to make sure you get enough fiber in your diet.  Avoid high FODMAP foods at meals to manage symptoms. Recommended foods Fruits Bananas, oranges, tangerines, lemons, limes, blueberries, raspberries, strawberries, grapes, cantaloupe, honeydew melon, kiwi, papaya, passion fruit, and pineapple. Limited amounts of dried cranberries, banana chips, and shredded coconut. Vegetables Eggplant, zucchini, cucumber, peppers, green beans, bean sprouts, lettuce, arugula, kale, Swiss chard, spinach, collard greens, bok choy, summer squash, potato, and tomato. Limited amounts of corn, carrot, and sweet potato. Green parts of scallions. Grains Gluten-free grains, such as rice, oats, buckwheat, quinoa, corn, polenta, and millet. Gluten-free pasta, bread, or cereal. Rice noodles. Corn tortillas. Meats and other proteins Unseasoned beef, pork, poultry, or fish. Eggs. Tomasa Blase. Tofu (firm) and tempeh. Limited amounts of nuts and seeds, such as almonds, walnuts, [place] nuts, pecans, peanuts, nut butters, pumpkin seeds, chia seeds, and sunflower seeds. Dairy Lactose-free milk, yogurt, and kefir. Lactose-free cottage cheese and ice cream. Non-dairy milks, such as almond, coconut, hemp, and rice milk. Non-dairy yogurt. Limited amounts of goat cheese, brie, mozzarella, parmesan, swiss, and other hard cheeses. Fats and oils Butter-free spreads. Vegetable oils, such as olive, canola, and  sunflower oil. Seasoning and other foods Artificial sweeteners with names that do not end in "ol," such as aspartame, saccharine, and stevia. Maple syrup, white table sugar, raw sugar, brown sugar, and molasses. Mayonnaise, soy sauce, and tamari. Fresh basil, coriander, parsley, rosemary, and thyme. Beverages Water and mineral water. Sugar-sweetened soft drinks. Small amounts of orange juice or cranberry juice. Black and green  tea. Most dry wines. Coffee. The items listed above may not be a complete list of foods and beverages you can eat. Contact a dietitian for more information. Foods to avoid Fruits Fresh, dried, and juiced forms of apple, pear, watermelon, peach, plum, cherries, apricots, blackberries, boysenberries, figs, nectarines, and mango. Avocado. Vegetables Chicory root, artichoke, asparagus, cabbage, snow peas, Brussels sprouts, broccoli, sugar snap peas, mushrooms, celery, and cauliflower. Onions, garlic, leeks, and the white part of scallions. Grains Wheat, including kamut, durum, and semolina. Barley and bulgur. Couscous. Wheat-based cereals. Wheat noodles, bread, crackers, and pastries. Meats and other proteins Fried or fatty meat. Sausage. Cashews and pistachios. Soybeans, baked beans, black beans, chickpeas, kidney beans, fava beans, navy beans, lentils, black-eyed peas, and split peas. Dairy Milk, yogurt, ice cream, and soft cheese. Cream and sour cream. Milk-based sauces. Custard. Buttermilk. Soy milk. Seasoning and other foods Any sugar-free gum or candy. Foods that contain artificial sweeteners such as sorbitol, mannitol, isomalt, or xylitol. Foods that contain honey, high-fructose corn syrup, or agave. Bouillon, vegetable stock, beef stock, and chicken stock. Garlic and onion powder. Condiments made with onion, such as hummus, chutney, pickles, relish, salad dressing, and salsa. Tomato paste. Beverages Chicory-based drinks. Coffee substitutes. Chamomile tea. Fennel tea.  Sweet or fortified wines such as port or sherry. Diet soft drinks made with isomalt, mannitol, maltitol, sorbitol, or xylitol. Apple, pear, and mango juice. Juices with high-fructose corn syrup. The items listed above may not be a complete list of foods and beverages you should avoid. Contact a dietitian for more information. Summary  FODMAP stands for fermentable oligosaccharides, disaccharides, monosaccharides, and polyols. These are sugars that are hard for some people to digest.  A low-FODMAP eating plan is a short-term diet that helps to ease symptoms of certain bowel diseases.  The eating plan usually lasts up to 6 weeks. After that, high-FODMAP foods are reintroduced gradually and one at a time. This can help you find out which foods may be causing symptoms.  A low-FODMAP eating plan can be complicated. It is best to work with a dietitian who has experience with this type of plan. This information is not intended to replace advice given to you by your health care provider. Make sure you discuss any questions you have with your health care provider. Document Revised: 05/11/2019 Document Reviewed: 05/11/2019 Elsevier Patient Education  2021 [employer].

## 2020-06-10 NOTE — Progress Notes (Signed)
Subjective:    Patient ID: Taylor Swanson, female    DOB: Jul 05, 1985, 35 y.o.   MRN: [id number]  Chief Complaint  Patient presents with  . Constipation    Stated she has had a small BM but not a normal, has been constipated for 1 week.  . Hemorrhoids    HPI Patient was seen today for ongoing concern.  Patient endorses history of constipation and hemorrhoids.  Constipation x 1+ wk.  Tried MiraLAX which did not help.  May have a BM 1-2 times per week.  Drinking a large cup of water at work then juice and tea at home.  Patient eating a lot of fast food.  At times has had black stools currently dark brown.  In the past patient states she "had 1 hemorrhoid now there's 2".   Pt has never seen GI.  Advised to use OTC preperation H cream daily x last few months.  States it helps some with the pruritis, but has not caused the hemorrhoids to shrink.  Past Medical History:  Diagnosis Date  . Anxiety   . Hypothyroid   . Self mutilation   . SELF MUTILATION 03/08/2007   Qualifier: History of  By: Fabian Sharp MD, Neta Mends     Allergies  Allergen Reactions  . Iron Other (See Comments)    CONSTIPATION    ROS General: Denies fever, chills, night sweats, changes in weight, changes in appetite HEENT: Denies headaches, ear pain, changes in vision, rhinorrhea, sore throat CV: Denies CP, palpitations, SOB, orthopnea Pulm: Denies SOB, cough, wheezing GI: Denies abdominal pain, nausea, vomiting, diarrhea  + constipation, hemorrhoids GU: Denies dysuria, hematuria, frequency, vaginal discharge Msk: Denies muscle cramps, joint pains Neuro: Denies weakness, numbness, tingling Skin: Denies rashes, bruising Psych: Denies depression, anxiety, hallucinations   Objective:    Blood pressure 102/78, pulse 87, temperature 98 F (36.7 C), temperature source Oral, weight 108 lb 3.2 oz (49.1 kg), SpO2 98 %.  Gen. Pleasant, well-nourished, in no distress, normal affect  HEENT: [place]/AT, face symmetric, conjunctiva clear,  no scleral icterus, PERRLA, EOMI, nares patent without drainage Lungs: no accessory muscle use, CTAB, no wheezes or rales Cardiovascular: RRR, no m/r/g, no peripheral edema Abdomen: BS present, soft, NT/ND, no hepatosplenomegaly. Neuro:  A&Ox3, CN II-XII intact, normal gait Skin:  Warm, dry, intact, healed scars on UEs b/l   Wt Readings from Last 3 Encounters:  06/10/20 108 lb 3.2 oz (49.1 kg)  03/22/20 110 lb (49.9 kg)  03/09/20 110 lb (49.9 kg)    Lab Results  Component Value Date   WBC 8.0 03/22/2020   HGB 13.1 03/22/2020   HCT 40.8 03/22/2020   PLT 318 03/22/2020   GLUCOSE 90 03/22/2020   CHOL 182 02/02/2020   TRIG 132.0 02/02/2020   HDL 46.30 02/02/2020   LDLCALC 109 (H) 02/02/2020   ALT 19 03/22/2020   AST 22 03/22/2020   NA 137 03/22/2020   K 4.2 03/22/2020   CL 105 03/22/2020   CREATININE 0.84 03/22/2020   BUN 11 03/22/2020   CO2 21 (L) 03/22/2020   TSH 5.39 (H) 02/02/2020    Assessment/Plan:  Hemorrhoids, unspecified hemorrhoid type  -ongoing h/o -discussed daily bowel regimen such as the miralax BID and reducing to once daily after having more consistent BMs. -Discussed the importance of increasing p.o. intake of water and fiber -Given handout -Rx for Anusol sent to pharmacy - Plan: hydrocortisone (ANUSOL-HC) 25 MG suppository, Ambulatory referral to Gastroenterology  Irritable bowel syndrome with constipation -  Discussed low FODMAP diet -Regular bowel regimen -Consider probiotic -Given handout  - Plan: Ambulatory referral to Gastroenterology  F/u as needed with PCP  Abbe Amsterdam, MD

## 2020-06-25 ENCOUNTER — Ambulatory Visit: Payer: 59 | Admitting: Internal Medicine

## 2020-06-26 ENCOUNTER — Other Ambulatory Visit: Payer: Self-pay | Admitting: Family Medicine

## 2020-06-26 ENCOUNTER — Ambulatory Visit: Payer: 59 | Admitting: Internal Medicine

## 2020-07-30 ENCOUNTER — Other Ambulatory Visit: Payer: Self-pay | Admitting: Family Medicine

## 2020-07-30 ENCOUNTER — Other Ambulatory Visit: Payer: Self-pay | Admitting: Internal Medicine

## 2020-07-30 DIAGNOSIS — F419 Anxiety disorder, unspecified: Secondary | ICD-10-CM

## 2020-08-01 NOTE — Telephone Encounter (Signed)
Record review okay to refill x4 months

## 2020-08-06 ENCOUNTER — Ambulatory Visit
Admission: EM | Admit: 2020-08-06 | Discharge: 2020-08-06 | Disposition: A | Discharge status: Home / Self Care | Payer: 59 | Attending: Urgent Care | Admitting: Urgent Care

## 2020-08-06 ENCOUNTER — Other Ambulatory Visit: Payer: Self-pay

## 2020-08-06 DIAGNOSIS — B349 Viral infection, unspecified: Secondary | ICD-10-CM

## 2020-08-06 DIAGNOSIS — Z20822 Contact with and (suspected) exposure to covid-19: Secondary | ICD-10-CM

## 2020-08-06 DIAGNOSIS — R519 Headache, unspecified: Secondary | ICD-10-CM

## 2020-08-06 DIAGNOSIS — R110 Nausea: Secondary | ICD-10-CM

## 2020-08-06 MED ORDER — ONDANSETRON 8 MG PO TBDP: 8.0000 mg | ORAL_TABLET | Freq: Three times a day (TID) | ORAL | 0 refills | Status: AC | PRN

## 2020-08-06 NOTE — ED Provider Notes (Signed)
[facility]   MRN: [id number] DOB: 1985/06/03  Subjective:   Taylor Swanson is a 35 y.o. female presenting for 1 day history of acute onset sinus headache, runny and stuffy nose, nausea without vomiting.  Patient would like to make sure that she gets a COVID-19 test.  Denies ear pain, sore throat, cough, chest pain, shortness of breath, abdominal pain, pelvic pain.  Patient is on birth control, is very consistent with that and had her period 2 weeks ago.  Does not care for pregnancy test.  She is not a smoker.  No current facility-administered medications for this encounter.  Current Outpatient Medications:    drospirenone-ethinyl estradiol (YASMIN) 3-0.03 MG tablet, Take 1 tablet by mouth daily., Disp: , Rfl:    drospirenone-ethinyl estradiol (YASMIN) 3-0.03 MG tablet, Take 1 tablet by mouth once daily, Disp: 28 tablet, Rfl: 0   drospirenone-ethinyl estradiol (YASMIN) 3-0.03 MG tablet, Take 1 tablet by mouth once daily, Disp: 28 tablet, Rfl: 4   hydrocortisone (ANUSOL-HC) 25 MG suppository, Place 1 suppository (25 mg total) rectally 2 (two) times daily., Disp: 12 suppository, Rfl: 0   PARoxetine (PAXIL) 20 MG tablet, Take 1 tablet by mouth once daily, Disp: 90 tablet, Rfl: 1   Allergies  Allergen Reactions   Iron Other (See Comments)    CONSTIPATION    Past Medical History:  Diagnosis Date   Anxiety    Hypothyroid    Self mutilation    SELF MUTILATION 03/08/2007   Qualifier: History of  By: Fabian Sharp MD, Neta Mends      Past Surgical History:  Procedure Laterality Date   FRACTURE SURGERY     RIGHT ARM    Family History  Problem Relation Age of Onset   Thyroid disease Mother    Acne Father     Social History   Tobacco Use   Smoking status: Never   Smokeless tobacco: Never  Vaping Use   Vaping Use: Never used  Substance Use Topics   Alcohol use: Yes    Comment: occ   Drug use: No    ROS   Objective:   Vitals: BP 109/69 (BP Location: Left Arm)    Pulse 97   Temp 99.8 F (37.7 C) (Oral)   Resp 18   LMP 07/22/2020   SpO2 98%   Physical Exam Constitutional:      General: She is not in acute distress.    Appearance: Normal appearance. She is well-developed. She is not ill-appearing, toxic-appearing or diaphoretic.  HENT:     Head: Normocephalic and atraumatic.     Right Ear: Tympanic membrane, ear canal and external ear normal. No drainage or tenderness. No middle ear effusion. There is no impacted cerumen. Tympanic membrane is not erythematous.     Left Ear: Tympanic membrane, ear canal and external ear normal. No drainage or tenderness.  No middle ear effusion. There is no impacted cerumen. Tympanic membrane is not erythematous.     Nose: Nose normal. No congestion or rhinorrhea.     Mouth/Throat:     Mouth: Mucous membranes are moist. No oral lesions.     Pharynx: Oropharynx is clear. No pharyngeal swelling, oropharyngeal exudate, posterior oropharyngeal erythema or uvula swelling.     Tonsils: No tonsillar exudate or tonsillar abscesses.  Eyes:     General: No scleral icterus.       Right eye: No discharge.        Left eye: No discharge.     Extraocular Movements:  Extraocular movements intact.     Right eye: Normal extraocular motion.     Left eye: Normal extraocular motion.     Conjunctiva/sclera: Conjunctivae normal.     Pupils: Pupils are equal, round, and reactive to light.  Cardiovascular:     Rate and Rhythm: Normal rate and regular rhythm.     Pulses: Normal pulses.  Pulmonary:     Effort: Pulmonary effort is normal.  Musculoskeletal:     Cervical back: Normal range of motion and neck supple. No rigidity or tenderness.  Lymphadenopathy:     Cervical: No cervical adenopathy.  Skin:    General: Skin is warm and dry.     Findings: No rash.  Neurological:     General: No focal deficit present.     Mental Status: She is alert and oriented to person, place, and time.     Cranial Nerves: No cranial nerve deficit or  facial asymmetry.     Motor: No weakness.     Coordination: Romberg sign negative. Coordination normal.     Gait: Gait normal.     Comments: Negative Kernig and Brudzinski.  Psychiatric:        Mood and Affect: Mood normal.        Behavior: Behavior normal.        Thought Content: Thought content normal.        Judgment: Judgment normal.    Assessment and Plan :   PDMP not reviewed this encounter.  1. Viral syndrome   2. Encounter for screening laboratory testing for COVID-19 virus   3. Acute nonintractable headache, unspecified headache type   4. Nausea without vomiting     Will manage for viral illness such as viral URI, viral syndrome, viral rhinitis, COVID-19. Counseled patient on nature of COVID-19 including modes of transmission, diagnostic testing, management and supportive care.  Offered scripts for symptomatic relief. COVID 19 testing is pending. Counseled patient on potential for adverse effects with medications prescribed/recommended today, ER and return-to-clinic precautions discussed, patient verbalized understanding.     Wallis Bamberg, [place] 08/06/20 1843

## 2020-08-06 NOTE — ED Triage Notes (Signed)
Pt c/o headache and nausea since yesterday. Requesting covid testing.

## 2020-08-06 NOTE — Discharge Instructions (Addendum)
We will notify you of your COVID-19 test results as they arrive and may take between 24 to 48 hours.  I encourage you to sign up for MyChart if you have not already done so as this can be the easiest way for [place] to communicate results to you online or through a phone app.  In the meantime, if you develop worsening symptoms including fever, chest pain, shortness of breath despite our current treatment plan then please report to the emergency room as this may be a sign of worsening status from possible COVID-19 infection.  Otherwise, we will manage this as a viral syndrome. For sore throat or cough try using a honey-based tea. Use 3 teaspoons of honey with juice squeezed from half lemon. Place shaved pieces of ginger into 1/2-1 cup of water and warm over stove top. Then mix the ingredients and repeat every 4 hours as needed. Please take Tylenol 500mg -650mg  every 6 hours for aches and pains, fevers. Hydrate very well with at least 2 liters of water. Eat light meals such as soups to replenish electrolytes and soft fruits, veggies. Start an antihistamine like Zyrtec, Allegra or Claritin for postnasal drainage, sinus congestion.  You can take this together with pseudoephedrine (Sudafed) at a dose of 60 mg 2-3 times a day as needed for the same kind of congestion.  You can also use Excedrin for migraines to help with your headaches.

## 2020-08-08 ENCOUNTER — Emergency Department (HOSPITAL_COMMUNITY)
Admission: EM | Admit: 2020-08-08 | Discharge: 2020-08-08 | Disposition: A | Discharge status: Home / Self Care | Payer: 59 | Attending: Emergency Medicine | Admitting: Emergency Medicine

## 2020-08-08 ENCOUNTER — Other Ambulatory Visit: Payer: Self-pay

## 2020-08-08 DIAGNOSIS — U071 COVID-19: Secondary | ICD-10-CM | POA: Insufficient documentation

## 2020-08-08 DIAGNOSIS — E039 Hypothyroidism, unspecified: Secondary | ICD-10-CM | POA: Diagnosis not present

## 2020-08-08 DIAGNOSIS — R112 Nausea with vomiting, unspecified: Secondary | ICD-10-CM

## 2020-08-08 LAB — COVID-19, FLU A+B NAA
Influenza A, NAA: NOT DETECTED
Influenza B, NAA: NOT DETECTED
SARS-CoV-2, NAA: DETECTED — AB

## 2020-08-08 MED ORDER — ONDANSETRON 4 MG PO TBDP: 4.0000 mg | ORAL_TABLET | Freq: Three times a day (TID) | ORAL | 0 refills | Status: DC | PRN

## 2020-08-08 MED ORDER — ONDANSETRON HCL 4 MG/2ML IJ SOLN
4.0000 mg | Freq: Once | INTRAMUSCULAR | Status: AC
  Administered 2020-08-08: 4 mg via INTRAVENOUS
  Filled 2020-08-08: qty 2

## 2020-08-08 NOTE — Discharge Instructions (Addendum)
You were seen today for nausea and vomiting.  Your COVID-19 testing was positive.  You do not meet criteria for treatment.  Take Zofran at home for nausea.  Make sure that you are staying hydrated.  If you develop worsening symptoms, you should be reevaluated.  Given that you are not vaccinated, you need to isolate and quarantine for 10 days.

## 2020-08-08 NOTE — ED Provider Notes (Addendum)
[facility]-EMERGENCY DEPT Provider Note   CSN: [id number] Arrival date & time: 08/08/20  7998     History No chief complaint on file.   Taylor Swanson is a 35 y.o. female.  HPI     This is a 35 year old female with a history of hypothyroidism who presents with nausea and vomiting.  She also recently has had upper respiratory symptoms including congestion and sinus headache.  She was seen and evaluated urgent care yesterday.  She had COVID testing but does not know the results.  She denies cough or shortness of breath.  No abdominal pain.  She does not believe herself to be pregnant.  Reports her last menstrual period was 2 weeks ago and she has not had sex in 2 months.  Patient reports multiple episodes of nonbilious, nonbloody emesis.  No diarrhea.  She has not taken anything for her symptoms.  SHe is not vaccinated.  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     Family History  Problem Relation Age of Onset   Thyroid disease Mother    Acne Father     Social History   Tobacco Use   Smoking status: Never   Smokeless tobacco: Never  Vaping Use   Vaping Use: Never used  Substance Use Topics   Alcohol use: Yes     Comment: occ   Drug use: No    Home Medications Prior to Admission medications   Medication Sig Start Date End Date Taking? Authorizing Provider  ondansetron (ZOFRAN ODT) 4 MG disintegrating tablet Take 1 tablet (4 mg total) by mouth every 8 (eight) hours as needed for nausea or vomiting. 08/08/20  Yes Nita Whitmire, Mayer Masker, MD  drospirenone-ethinyl estradiol (YASMIN) 3-0.03 MG tablet Take 1 tablet by mouth daily. 04/19/20   [provider]  drospirenone-ethinyl estradiol (YASMIN) 3-0.03 MG tablet Take 1 tablet by mouth once daily 06/26/20   Panosh, Neta Mends, MD  drospirenone-ethinyl estradiol (YASMIN) 3-0.03 MG tablet Take 1 tablet by mouth once daily 08/01/20   Panosh, Neta Mends, MD  hydrocortisone (ANUSOL-HC) 25 MG suppository Place 1 suppository (25 mg total) rectally 2 (two) times daily. 06/10/20   Deeann Saint, MD  ondansetron (ZOFRAN-ODT) 8 MG disintegrating tablet Take 1 tablet (8 mg total) by mouth every 8 (eight) hours as needed for nausea or vomiting. 08/06/20   Wallis Bamberg, PA-C  PARoxetine (PAXIL) 20 MG tablet Take 1 tablet by mouth once daily 07/31/20   Panosh, Neta Mends, MD    Allergies    Iron  Review of Systems   Review of Systems  Constitutional:  Negative for fever.  Respiratory:  Negative for cough and shortness of breath.   Cardiovascular:  Negative for chest pain.  Gastrointestinal:  Positive for nausea and vomiting. Negative for abdominal pain.  Genitourinary:  Negative for dysuria.  All other systems reviewed and are negative.  Physical Exam Updated Vital Signs BP 103/84   Pulse 77   Temp 98.2 F (36.8 C) (Oral)   Resp 18   LMP 07/22/2020   SpO2 99%   Physical Exam Vitals and nursing note reviewed.  Constitutional:      Appearance: She is well-developed.     Comments: Thin, nontoxic-appearing, no acute distress  HENT:     Head: Normocephalic and atraumatic.     Nose: Nose normal.     Mouth/Throat:     Mouth: Mucous membranes are moist.  Eyes:      Pupils: Pupils are equal, round, and reactive to light.  Cardiovascular:     Rate and Rhythm: Normal rate and regular rhythm.     Heart sounds: Normal heart sounds.  Pulmonary:     Effort: Pulmonary effort is normal. No respiratory distress.     Breath sounds: No wheezing.  Abdominal:     General: Bowel sounds are normal.     Palpations: Abdomen is soft.     Tenderness: There is no guarding or rebound.  Musculoskeletal:     Cervical back: Neck supple.  Skin:    General: Skin is warm and dry.  Neurological:     Mental Status: She is alert and oriented to person, place, and time.  Psychiatric:        Mood and Affect: Mood normal.    ED Results / Procedures / Treatments   Labs (all labs ordered are listed, but only abnormal results are displayed) Labs Reviewed  PREGNANCY, URINE    EKG None  Radiology No results found.  Procedures Procedures   Medications Ordered in ED Medications  ondansetron (ZOFRAN) injection 4 mg (4 mg Intravenous Given 08/08/20 6384)    ED Course  I have reviewed the triage vital signs and the nursing notes.  Pertinent labs & imaging results that were available during my care of the patient were reviewed by me and considered in my medical decision making (see chart for details).    MDM Rules/Calculators/A&P                           Patient presents with persistent nausea and vomiting.  I have reviewed her chart.  COVID test was positive from yesterday.  Patient informed of these findings.  Her exam is fairly benign.  Vital signs are reassuring.  She is not significantly tachycardic or hypotensive.  Supportive measures recommended at home.  She was given Zofran ODT.  After history, exam, and medical workup I feel the patient has been appropriately medically screened and is safe for discharge home. Pertinent diagnoses were discussed with the patient. Patient was given return precautions.  Final Clinical Impression(s) / ED Diagnoses Final  diagnoses:  COVID-19  Non-intractable vomiting with nausea, unspecified vomiting type    Rx / DC Orders ED Discharge Orders          Ordered    ondansetron (ZOFRAN ODT) 4 MG disintegrating tablet  Every 8 hours PRN        08/08/20 0647             Juliani Laduke, Mayer Masker, MD 08/08/20 [id number]    Shon Baton, MD 08/08/20 [phone]

## 2020-08-08 NOTE — ED Triage Notes (Signed)
Pt tested positive for Covid yesterday. N/V, fever, fatigue.

## 2020-08-14 ENCOUNTER — Telehealth: Payer: 59 | Admitting: Family Medicine

## 2021-01-02 ENCOUNTER — Other Ambulatory Visit: Payer: Self-pay | Admitting: Internal Medicine

## 2021-01-30 ENCOUNTER — Other Ambulatory Visit: Payer: Self-pay | Admitting: Internal Medicine

## 2021-01-30 DIAGNOSIS — F419 Anxiety disorder, unspecified: Secondary | ICD-10-CM

## 2021-02-03 ENCOUNTER — Other Ambulatory Visit: Payer: Self-pay | Admitting: Internal Medicine

## 2021-02-04 ENCOUNTER — Other Ambulatory Visit: Payer: Self-pay | Admitting: Internal Medicine

## 2021-02-04 DIAGNOSIS — F419 Anxiety disorder, unspecified: Secondary | ICD-10-CM

## 2021-02-04 NOTE — Telephone Encounter (Signed)
Patient called in stating that the pharmacy has faxed over two sheets for a medication refill for drospirenone-ethinyl estradiol (YASMIN) 3-0.03 MG tablet [[id number]] and PARoxetine (PAXIL) 20 MG tablet [[id number]]  to be sent to the pharmacy.  Patient could be contacted at [phone].  Please advise.

## 2021-02-05 NOTE — Telephone Encounter (Signed)
Refill x 90 days ,  make her appt   med check.

## 2021-02-05 NOTE — Telephone Encounter (Signed)
Patient called to see if she could get a temporary refill on drospirenone-ethinyl estradiol (YASMIN) 3-0.03 MG tablet    PARoxetine (PAXIL) 20 MG tablet     Patient does have OV scheduled for Tuesday 02/11/21    Please advise

## 2021-02-05 NOTE — Telephone Encounter (Signed)
Please send the refill  in

## 2021-02-06 MED ORDER — PAROXETINE HCL 20 MG PO TABS: 20.0000 mg | ORAL_TABLET | Freq: Every day | ORAL | 0 refills | Status: DC

## 2021-02-06 MED ORDER — DROSPIRENONE-ETHINYL ESTRADIOL 3-0.03 MG PO TABS: 1.0000 | ORAL_TABLET | Freq: Every day | ORAL | 0 refills | Status: DC

## 2021-02-06 NOTE — Telephone Encounter (Signed)
This is second message  ok to send

## 2021-02-11 ENCOUNTER — Encounter: Payer: Self-pay | Admitting: Internal Medicine

## 2021-02-11 ENCOUNTER — Ambulatory Visit (INDEPENDENT_AMBULATORY_CARE_PROVIDER_SITE_OTHER): Payer: Self-pay | Admitting: Internal Medicine

## 2021-02-11 VITALS — BP 120/80 | HR 81 | Temp 98.3°F | Ht 64.0 in | Wt 108.0 lb

## 2021-02-11 DIAGNOSIS — Z79899 Other long term (current) drug therapy: Secondary | ICD-10-CM

## 2021-02-11 DIAGNOSIS — F419 Anxiety disorder, unspecified: Secondary | ICD-10-CM

## 2021-02-11 DIAGNOSIS — Z5989 Other problems related to housing and economic circumstances: Secondary | ICD-10-CM

## 2021-02-11 DIAGNOSIS — E039 Hypothyroidism, unspecified: Secondary | ICD-10-CM

## 2021-02-11 DIAGNOSIS — Z3041 Encounter for surveillance of contraceptive pills: Secondary | ICD-10-CM

## 2021-02-11 DIAGNOSIS — R739 Hyperglycemia, unspecified: Secondary | ICD-10-CM

## 2021-02-11 MED ORDER — DROSPIRENONE-ETHINYL ESTRADIOL 3-0.03 MG PO TABS: 1.0000 | ORAL_TABLET | Freq: Every day | ORAL | 11 refills | Status: DC

## 2021-02-11 MED ORDER — PAROXETINE HCL 20 MG PO TABS: 20.0000 mg | ORAL_TABLET | Freq: Every day | ORAL | 3 refills | Status: DC

## 2021-02-11 NOTE — Patient Instructions (Signed)
Good to see you today checking thryoid     Refilled medication  Look into marketplace insurance  subsidy .

## 2021-02-11 NOTE — Progress Notes (Signed)
Chief Complaint  Patient presents with   Medication Refill    HPI: Taylor Swanson 36 y.o. come in for med management   no longer has insurance "cant afford " Working FT  OCP: helps acne  well wants to continue periods nl Paxil  : helps anxiety a good bit  wants refills  Bowels :some constipations but cost of foods   not a lot of fiber to help  no vomiting weight loss  has hemorrhoid flare up at times  no bleeding.  Never got thyroid fu from last time Gets sa at times   after eating  Live independent  family hsps with some  costs  Works 40  hours   Sleep 8 hours  ROS: See pertinent positives and negatives per HPI.  Past Medical History:  Diagnosis Date   Anxiety    Hypothyroid    Self mutilation    SELF MUTILATION 03/08/2007   Qualifier: History of  By: Fabian Sharp MD, Neta Mends     Family History  Problem Relation Age of Onset   Thyroid disease Mother    Acne Father     Social History   Socioeconomic History   Marital status: Single    Spouse name: Not on file   Number of children: Not on file   Years of education: Not on file   Highest education level: 11th grade  Occupational History   Not on file  Tobacco Use   Smoking status: Never   Smokeless tobacco: Never  Vaping Use   Vaping Use: Never used  Substance and Sexual Activity   Alcohol use: Yes    Comment: occ   Drug use: No   Sexual activity: Never    Birth control/protection: Pill  Other Topics Concern   Not on file  Social History Narrative   Single   Regular exercise- yes sometimes    ? Tad at home finishing hs on line [employer] to get hs diploma         Lives in her household with 9 cats and dogs likes animals takes care of them.    trying to get her driver's license.      Neg tad    Social Determinants of Health   Financial Resource Strain: High Risk   Difficulty of Paying Living Expenses: Very hard  Food Insecurity: Geophysicist/field seismologist Present   Worried About Programme researcher, broadcasting/film/video in  the Last Year: Often true   Barista in the Last Year: Sometimes true  Transportation Needs: Personal assistant (Medical): Yes   Lack of Transportation (Non-Medical): Yes  Physical Activity: Insufficiently Active   Days of Exercise per Week: 3 days   Minutes of Exercise per Session: 20 min  Stress: Stress Concern Present   Feeling of Stress : Very much  Social Connections: Moderately Isolated   Frequency of Communication with Friends and Family: Once a week   Frequency of Social Gatherings with Friends and Family: Once a week   Attends Religious Services: More than 4 times per year   Active Member of [employer] or Organizations: Yes   Attends Banker Meetings: 1 to 4 times per year   Marital Status: Never married    Outpatient Medications Prior to Visit  Medication Sig Dispense Refill   hydrocortisone (ANUSOL-HC) 25 MG suppository Place 1 suppository (25 mg total) rectally 2 (two) times daily. 12 suppository 0   ondansetron (ZOFRAN ODT) 4 MG  disintegrating tablet Take 1 tablet (4 mg total) by mouth every 8 (eight) hours as needed for nausea or vomiting. 20 tablet 0   ondansetron (ZOFRAN-ODT) 8 MG disintegrating tablet Take 1 tablet (8 mg total) by mouth every 8 (eight) hours as needed for nausea or vomiting. 20 tablet 0   PARoxetine (PAXIL) 20 MG tablet Take 1 tablet (20 mg total) by mouth daily. 30 tablet 0   drospirenone-ethinyl estradiol (YASMIN) 3-0.03 MG tablet Take 1 tablet by mouth daily. (Patient not taking: Reported on 02/11/2021)     drospirenone-ethinyl estradiol (YASMIN) 3-0.03 MG tablet Take 1 tablet by mouth once daily (Patient not taking: Reported on 02/11/2021) 28 tablet 0   drospirenone-ethinyl estradiol (YASMIN) 3-0.03 MG tablet Take 1 tablet by mouth daily. 28 tablet 0   No facility-administered medications prior to visit.     EXAM:  BP 120/80 (BP Location: Left Arm, Patient Position: Sitting, Cuff Size: Normal)     Pulse 81    Temp 98.3 F (36.8 C) (Oral)    Ht 5\' 4"  (1.626 m)    Wt 108 lb (49 kg)    LMP 02/04/2021    SpO2 98%    BMI 18.54 kg/m   Body mass index is 18.54 kg/m.  GENERAL: vitals reviewed and listed above, alert, oriented, appears well hydrated and in no acute distress HEENT: atraumatic, conjunctiva  clear, no obvious abnormalities on inspection of external nose and ears OP : masked  NECK: no obvious masses on inspection palpation  LUNGS: clear to auscultation bilaterally, no wheezes, rales or rhonchi, good air movement CV: HRRR, no clubbing cyanosis or  peripheral edema nl cap refill  Abdomen:  Sof,t normal bowel sounds without hepatosplenomegaly, no guarding rebound or masses no CVA tenderness MS: moves all extremities without noticeable focal  abnormality Skin faded acne face  clear  PSYCH: pleasant and cooperative, no obvious depression or anxiety Lab Results  Component Value Date   WBC 8.0 03/22/2020   HGB 13.1 03/22/2020   HCT 40.8 03/22/2020   PLT 318 03/22/2020   GLUCOSE 90 03/22/2020   CHOL 182 02/02/2020   TRIG 132.0 02/02/2020   HDL 46.30 02/02/2020   LDLCALC 109 (H) 02/02/2020   ALT 19 03/22/2020   AST 22 03/22/2020   NA 137 03/22/2020   K 4.2 03/22/2020   CL 105 03/22/2020   CREATININE 0.84 03/22/2020   BUN 11 03/22/2020   CO2 21 (L) 03/22/2020   TSH 5.39 (H) 02/02/2020   BP Readings from Last 3 Encounters:  02/11/21 120/80  08/08/20 103/84  08/06/20 109/69    ASSESSMENT AND PLAN:  Discussed the following assessment and plan:  Anxiety disorder, unspecified type - continue paxil - Plan: PARoxetine (PAXIL) 20 MG tablet  Hypothyroidism, unspecified type subclinical  - Plan: TSH, T4, free, Hemoglobin A1c, Hemoglobin A1c, T4, free, TSH  Medication management - Plan: TSH, T4, free, Hemoglobin A1c, Hemoglobin A1c, T4, free, TSH  Oral contraceptive pill surveillance  Elevated blood sugar - in past  limit sugar beverages - Plan: TSH, T4, free, Hemoglobin  A1c, Hemoglobin A1c, T4, free, TSH  Does not have health insurance Disc constipation and check tsh today consider medication -Patient advised to return or notify health care team  if  new concerns arise.  Patient Instructions  Good to see you today checking thryoid     Refilled medication  Look into marketplace insurance  subsidy .  10/06/20. Sheyanne Munley M.D.

## 2021-02-12 ENCOUNTER — Encounter: Payer: Self-pay | Admitting: Internal Medicine

## 2021-02-12 LAB — T4, FREE: Free T4: 0.62 ng/dL (ref 0.60–1.60)

## 2021-02-12 LAB — TSH: TSH: 9.06 u[IU]/mL — ABNORMAL HIGH (ref 0.35–5.50)

## 2021-02-12 LAB — HEMOGLOBIN A1C: Hgb A1c MFr Bld: 5.3 % (ref 4.6–6.5)

## 2021-02-13 ENCOUNTER — Encounter: Payer: Self-pay | Admitting: Internal Medicine

## 2021-02-13 ENCOUNTER — Other Ambulatory Visit: Payer: Self-pay | Admitting: Internal Medicine

## 2021-02-13 DIAGNOSIS — F419 Anxiety disorder, unspecified: Secondary | ICD-10-CM

## 2021-02-13 MED ORDER — LEVOTHYROXINE SODIUM 75 MCG PO TABS: 75.0000 ug | ORAL_TABLET | Freq: Every day | ORAL | 1 refills | Status: DC

## 2021-02-13 NOTE — Progress Notes (Signed)
So the thyroid test is still off called subclinical hypothyroid which may be adding to your symptoms.of constipation and how you feel overall.    Your blood sugar was fine. I advise  begin  thyroid medicine that you take every day hen repeat the thyroid test in 3 months.  Please send into her pharmacy ,levothyroxine 75 mcg take 1 p.o. daily dispense 90 refill x1 Arrange follow-up blood work TSH free T4 in 2 to 3 months after beginning the medication. And make a follow up visit virtual or  in person  after results are back .

## 2021-02-13 NOTE — Telephone Encounter (Signed)
Pt informed of the results and verbalized understanding. Lab orders placed, Rx sent. Pt to call back to schedule f/u.

## 2021-02-13 NOTE — Telephone Encounter (Signed)
Please see result note for lab results  for instructions and arrange med and lab  fu etc

## 2021-02-13 NOTE — Addendum Note (Signed)
Addended by: Christy Sartorius on: 02/13/2021 10:11 AM   Modules accepted: Orders

## 2021-02-14 ENCOUNTER — Other Ambulatory Visit (HOSPITAL_COMMUNITY): Payer: Self-pay

## 2021-02-14 MED ORDER — PAROXETINE HCL 20 MG PO TABS: 20.0000 mg | ORAL_TABLET | Freq: Every day | ORAL | 3 refills | Status: DC

## 2021-02-14 MED ORDER — DROSPIRENONE-ETHINYL ESTRADIOL 3-0.03 MG PO TABS: 1.0000 | ORAL_TABLET | Freq: Every day | ORAL | 11 refills | Status: DC

## 2021-02-14 MED ORDER — DROSPIRENONE-ETHINYL ESTRADIOL 3-0.03 MG PO TABS
1.0000 | ORAL_TABLET | Freq: Every day | ORAL | 11 refills | Status: DC
  Filled 2021-02-14: qty 28, 28d supply, fill #0

## 2021-02-14 MED ORDER — LEVOTHYROXINE SODIUM 75 MCG PO TABS: 75.0000 ug | ORAL_TABLET | Freq: Every day | ORAL | 1 refills | Status: DC

## 2021-07-21 ENCOUNTER — Encounter: Payer: Self-pay | Admitting: Internal Medicine

## 2021-07-21 ENCOUNTER — Ambulatory Visit (INDEPENDENT_AMBULATORY_CARE_PROVIDER_SITE_OTHER): Payer: Self-pay | Admitting: Internal Medicine

## 2021-07-21 VITALS — BP 120/80 | HR 81 | Temp 98.5°F | Ht 64.0 in | Wt 105.2 lb

## 2021-07-21 DIAGNOSIS — Z5989 Other problems related to housing and economic circumstances: Secondary | ICD-10-CM

## 2021-07-21 DIAGNOSIS — K581 Irritable bowel syndrome with constipation: Secondary | ICD-10-CM

## 2021-07-21 DIAGNOSIS — Z79899 Other long term (current) drug therapy: Secondary | ICD-10-CM

## 2021-07-21 DIAGNOSIS — E039 Hypothyroidism, unspecified: Secondary | ICD-10-CM

## 2021-07-21 NOTE — Progress Notes (Signed)
Chief Complaint  Patient presents with   Follow-up    Need work letter stating can't lift over 50 lbs    HPI: Taylor Swanson 36 y.o. come in for follow-up of abnormal TSH see February notes. Since that time she has been taking levothyroxine occasional medicine on weekends but tries to take it on a regular basis  Sleep ok . Works full-time at [employer] .  As a Scientist, water quality.  Struggles and winded.  Has to lift very heavy products such as 50 pound bags into the back of a car.  Employer needs note about limiting lifting for her as she is just physically not able to do this.   Still has no insurance because it is "too expensive" would be $60 and she cannot afford diet.  Look into it as renewal time. Anxiety is stable about the same.  ROS: See pertinent positives and negatives per HPI.  Has ongoing hemorrhoids off and on constipation can use MiraLAX no fever or blood  Past Medical History:  Diagnosis Date   Anxiety    Hypothyroid    Self mutilation    SELF MUTILATION 03/08/2007   Qualifier: History of  By: Regis Bill MD, Standley Brooking     Family History  Problem Relation Age of Onset   Thyroid disease Mother    Acne Father     Social History   Socioeconomic History   Marital status: Single    Spouse name: Not on file   Number of children: Not on file   Years of education: Not on file   Highest education level: 11th grade  Occupational History   Not on file  Tobacco Use   Smoking status: Never   Smokeless tobacco: Never  Vaping Use   Vaping Use: Never used  Substance and Sexual Activity   Alcohol use: Yes    Comment: occ   Drug use: No   Sexual activity: Never    Birth control/protection: Pill  Other Topics Concern   Not on file  Social History Narrative   Single   Regular exercise- yes sometimes    ? Tad at home finishing hs on line [place] to get hs diploma         Lives in her household with 9 cats and dogs likes animals takes care of them.    trying  to get her driver's license.      Neg tad    Social Determinants of Health   Financial Resource Strain: High Risk (02/10/2021)   Overall Financial Resource Strain (CARDIA)    Difficulty of Paying Living Expenses: Very hard  Food Insecurity: Food Insecurity Present (02/10/2021)   Hunger Vital Sign    Worried About Running Out of Food in the Last Year: Often true    Ran Out of Food in the Last Year: Sometimes true  Transportation Needs: Unmet Transportation Needs (02/10/2021)   PRAPARE - Hydrologist (Medical): Yes    Lack of Transportation (Non-Medical): Yes  Physical Activity: Insufficiently Active (02/10/2021)   Exercise Vital Sign    Days of Exercise per Week: 3 days    Minutes of Exercise per Session: 20 min  Stress: Stress Concern Present (02/10/2021)   [place]    Feeling of Stress : Very much  Social Connections: Moderately Isolated (02/10/2021)   Social Connection and Isolation Panel [NHANES]    Frequency of Communication with Friends and Family: Once a  week    Frequency of Social Gatherings with Friends and Family: Once a week    Attends Religious Services: More than 4 times per year    Active Member of Clubs or Organizations: Yes    Attends Banker Meetings: 1 to 4 times per year    Marital Status: Never married    Outpatient Medications Prior to Visit  Medication Sig Dispense Refill   drospirenone-ethinyl estradiol (YASMIN) 3-0.03 MG tablet Take 1 tablet by mouth daily. 28 tablet 11   levothyroxine (SYNTHROID) 75 MCG tablet Take 1 tablet (75 mcg total) by mouth daily. 90 tablet 1   ondansetron (ZOFRAN ODT) 4 MG disintegrating tablet Take 1 tablet (4 mg total) by mouth every 8 (eight) hours as needed for nausea or vomiting. 20 tablet 0   ondansetron (ZOFRAN-ODT) 8 MG disintegrating tablet Take 1 tablet (8 mg total) by mouth every 8 (eight) hours as needed for nausea or  vomiting. 20 tablet 0   PARoxetine (PAXIL) 20 MG tablet Take 1 tablet (20 mg total) by mouth daily. 90 tablet 3   drospirenone-ethinyl estradiol (YASMIN) 3-0.03 MG tablet Take 1 tablet by mouth once daily 28 tablet 0   drospirenone-ethinyl estradiol (YASMIN) 3-0.03 MG tablet Take 1 tablet by mouth daily. 28 tablet 11   hydrocortisone (ANUSOL-HC) 25 MG suppository Place 1 suppository (25 mg total) rectally 2 (two) times daily. (Patient not taking: Reported on 07/21/2021) 12 suppository 0   No facility-administered medications prior to visit.     EXAM:  BP 120/80 (BP Location: Left Arm, Patient Position: Sitting, Cuff Size: Normal)   Pulse 81   Temp 98.5 F (36.9 C) (Oral)   Ht 5\' 4"  (1.626 m)   Wt 105 lb 3.2 oz (47.7 kg)   LMP 07/07/2021   SpO2 100%   BMI 18.06 kg/m   Body mass index is 18.06 kg/m. Wt Readings from Last 3 Encounters:  07/21/21 105 lb 3.2 oz (47.7 kg)  02/11/21 108 lb (49 kg)  06/10/20 108 lb 3.2 oz (49.1 kg)    GENERAL: vitals reviewed and listed above, alert, oriented, appears well hydrated and in no acute distress HEENT: atraumatic, conjunctiva  clear, no obvious abnormalities on inspection of external nose and ears  NECK: no obvious masses on inspection palpation  LUNGS: clear to auscultation bilaterally, no wheezes, rales or rhonchi, good air movement CV: HRRR, no clubbing cyanosis or  peripheral edema nl cap refill  MS: moves all extremities without noticeable focal  abnormality PSYCH: pleasant and cooperative, no obvious depression or anxiety Lab Results  Component Value Date   WBC 8.0 03/22/2020   HGB 13.1 03/22/2020   HCT 40.8 03/22/2020   PLT 318 03/22/2020   GLUCOSE 90 03/22/2020   CHOL 182 02/02/2020   TRIG 132.0 02/02/2020   HDL 46.30 02/02/2020   LDLCALC 109 (H) 02/02/2020   ALT 19 03/22/2020   AST 22 03/22/2020   NA 137 03/22/2020   K 4.2 03/22/2020   CL 105 03/22/2020   CREATININE 0.84 03/22/2020   BUN 11 03/22/2020   CO2 21 (L)  03/22/2020   TSH 9.06 (H) 02/11/2021   HGBA1C 5.3 02/11/2021   BP Readings from Last 3 Encounters:  07/21/21 120/80  02/11/21 120/80  08/08/20 103/84    ASSESSMENT AND PLAN:  Discussed the following assessment and plan:  Hypothyroidism, unspecified type subclinical  - Plan: T4, Free, TSH  Does not have health insurance  Medication management  Irritable bowel syndrome with constipation Currently  on 75 mcg check labs today depending on results we will refill to 75 or adjust dosing as indicated In regard to ongoing years of constipation discussed MiraLAX increase fiber liquids etc. Suggest she look into different insurance products especially if at work. -Patient advised to return or notify health care team  if  new concerns arise. Note communication printed and signed for work limitation of less than 40 pounds lifting. Patient Instructions  Good to see you today . Checking  lab today and plan for  dosing of thyroid.  Will  do letter .  For work.   Will send in refill depending on results of lab work . Please look into insurance at work  or [employer] or marketplace  insurance at least catastrophic.  Insurance .     Neta Mends. Hazel Wrinkle M.D.

## 2021-07-21 NOTE — Patient Instructions (Addendum)
Good to see you today . Checking  lab today and plan for  dosing of thyroid.  Will  do letter .  For work.   Will send in refill depending on results of lab work . Please look into insurance at work  or [employer] or marketplace  insurance at least catastrophic.  Insurance .

## 2021-07-22 ENCOUNTER — Other Ambulatory Visit: Payer: Self-pay

## 2021-07-22 DIAGNOSIS — E039 Hypothyroidism, unspecified: Secondary | ICD-10-CM

## 2021-07-22 LAB — T4, FREE: Free T4: 0.55 ng/dL — ABNORMAL LOW (ref 0.60–1.60)

## 2021-07-22 LAB — TSH: TSH: 12.14 u[IU]/mL — ABNORMAL HIGH (ref 0.35–5.50)

## 2021-07-22 MED ORDER — LEVOTHYROXINE SODIUM 100 MCG PO TABS: 100.0000 ug | ORAL_TABLET | Freq: Every day | ORAL | 1 refills | Status: DC

## 2021-07-22 NOTE — Progress Notes (Signed)
Thyroid shows we need to increase the dose of thyroid medication stop the 75 mcg dose and then begin  synthroid generic ok  100 mcg per day :disp 90 refill x 1  Please send in rx to her pharmacy  Recheck  make lab tsh and free t4 in 3-4 mos ( no OV needed)

## 2021-09-08 ENCOUNTER — Telehealth: Payer: Self-pay | Admitting: Nurse Practitioner

## 2021-09-08 DIAGNOSIS — J069 Acute upper respiratory infection, unspecified: Secondary | ICD-10-CM

## 2021-09-08 DIAGNOSIS — R053 Chronic cough: Secondary | ICD-10-CM

## 2021-09-08 MED ORDER — BENZONATATE 100 MG PO CAPS
100.0000 mg | ORAL_CAPSULE | Freq: Two times a day (BID) | ORAL | 0 refills | Status: DC | PRN
Start: 2021-09-08 — End: 2022-08-04

## 2021-09-08 NOTE — Patient Instructions (Addendum)
  Raoul Pitch, thank you for joining [name], NP for today's virtual visit.  While this provider is not your primary care provider (PCP), if your PCP is located in our provider database this encounter information will be shared with them immediately following your visit.  Consent: (Patient) Taylor Swanson provided verbal consent for this virtual visit at the beginning of the encounter.  Current Medications:  Current Outpatient Medications:    benzonatate (TESSALON) 100 MG capsule, Take 1-2 capsules (100-200 mg total) by mouth 2 (two) times daily as needed for cough., Disp: 40 capsule, Rfl: 0   drospirenone-ethinyl estradiol (YASMIN) 3-0.03 MG tablet, Take 1 tablet by mouth once daily, Disp: 28 tablet, Rfl: 0   drospirenone-ethinyl estradiol (YASMIN) 3-0.03 MG tablet, Take 1 tablet by mouth daily., Disp: 28 tablet, Rfl: 11   hydrocortisone (ANUSOL-HC) 25 MG suppository, Place 1 suppository (25 mg total) rectally 2 (two) times daily. (Patient not taking: Reported on 07/21/2021), Disp: 12 suppository, Rfl: 0   levothyroxine (SYNTHROID) 100 MCG tablet, Take 1 tablet (100 mcg total) by mouth daily before breakfast., Disp: 90 tablet, Rfl: 1   levothyroxine (SYNTHROID) 75 MCG tablet, Take 1 tablet (75 mcg total) by mouth daily., Disp: 90 tablet, Rfl: 1   ondansetron (ZOFRAN ODT) 4 MG disintegrating tablet, Take 1 tablet (4 mg total) by mouth every 8 (eight) hours as needed for nausea or vomiting., Disp: 20 tablet, Rfl: 0   ondansetron (ZOFRAN-ODT) 8 MG disintegrating tablet, Take 1 tablet (8 mg total) by mouth every 8 (eight) hours as needed for nausea or vomiting., Disp: 20 tablet, Rfl: 0   PARoxetine (PAXIL) 20 MG tablet, Take 1 tablet (20 mg total) by mouth daily., Disp: 90 tablet, Rfl: 3   Medications ordered in this encounter:  Meds ordered this encounter  Medications   benzonatate (TESSALON) 100 MG capsule    Sig: Take 1-2 capsules (100-200 mg total) by mouth 2 (two) times daily as  needed for cough.    Dispense:  40 capsule    Refill:  0    Order Specific Question:   Supervising Provider    Answer:   Hyacinth Meeker, BRIAN [[id number]]     *If you need refills on other medications prior to your next appointment, please contact your pharmacy*  Follow-Up: Call back or seek an in-person evaluation if the symptoms worsen or if the condition fails to improve as anticipated in 7-10 days.     If you have been instructed to have an in-person evaluation today at a local Urgent Care facility, please use the link below. It will take you to a list of all of our available [facility], including address, phone number and hours of operation. Please do not delay care.  [facility]  If you or a family member do not have a primary care provider, use the link below to schedule a visit and establish care. When you choose a [facility] primary care physician or advanced practice provider, you gain a long-term partner in health. Find a Primary Care Provider  Learn more about [facility]'s in-office and virtual care options: [facility] Get Care Now

## 2021-09-08 NOTE — Progress Notes (Signed)
Virtual Visit Consent   Taylor SEVERNS, you are scheduled for a virtual visit with a [facility] provider today. Just as with appointments in the office, your consent must be obtained to participate. Your consent will be active for this visit and any virtual visit you may have with one of our providers in the next 365 days. If you have a MyChart account, a copy of this consent can be sent to you electronically.  As this is a virtual visit, video technology does not allow for your provider to perform a traditional examination. This may limit your provider's ability to fully assess your condition. If your provider identifies any concerns that need to be evaluated in person or the need to arrange testing (such as labs, EKG, etc.), we will make arrangements to do so. Although advances in technology are sophisticated, we cannot ensure that it will always work on either your end or our end. If the connection with a video visit is poor, the visit may have to be switched to a telephone visit. With either a video or telephone visit, we are not always able to ensure that we have a secure connection.  By engaging in this virtual visit, you consent to the provision of healthcare and authorize for your insurance to be billed (if applicable) for the services provided during this visit. Depending on your insurance coverage, you may receive a charge related to this service.  I need to obtain your verbal consent now. Are you willing to proceed with your visit today? Taylor Swanson has provided verbal consent on 09/08/2021 for a virtual visit (video or telephone). Taylor Rigg, NP  Date: 09/08/2021 9:26 AM  Virtual Visit via Video Note   I, Taylor Swanson, connected with  Taylor Swanson  ([id number], November 11, 1985) on 09/08/21 at  9:30 AM EDT by a video-enabled telemedicine application and verified that I am speaking with the correct person using two identifiers.  Location: Patient: Virtual Visit Location Patient:  Home Provider: Virtual Visit Location Provider: Home Office   I discussed the limitations of evaluation and management by telemedicine and the availability of in person appointments. The patient expressed understanding and agreed to proceed.    History of Present Illness: Taylor Swanson is a 36 y.o. who identifies as a female who was assigned female at birth, and is being seen today for dry persistent cough x 4 days.    The cough is non-productive, without wheezing, dyspnea or hemoptysis and is aggravated by nothing Associated symptoms include: states she had chills and headache earlier this week . Patient does not have new pets. Patient does not have a history of asthma. Patient does not have a history of environmental allergens. Patient did not have recent travel. Patient does not have a history of smoking. 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 09/07/2007   ACNE VULGARIS, FACIAL 03/08/2007   ANXIETY 05/18/2006    Allergies:  Allergies  Allergen Reactions   Iron Other (See Comments)    CONSTIPATION   Medications:  Current Outpatient Medications:    benzonatate (TESSALON) 100 MG capsule, Take 1-2 capsules (100-200 mg total) by mouth 2 (two) times daily as needed for cough., Disp: 40 capsule, Rfl: 0   drospirenone-ethinyl estradiol (YASMIN) 3-0.03 MG tablet, Take 1 tablet by mouth once daily, Disp: 28 tablet, Rfl: 0   drospirenone-ethinyl estradiol (YASMIN) 3-0.03 MG tablet, Take 1 tablet by mouth daily., Disp: 28 tablet, Rfl: 11    hydrocortisone (ANUSOL-HC) 25 MG suppository, Place 1 suppository (25 mg total) rectally 2 (two) times daily. (Patient not taking: Reported on 07/21/2021), Disp: 12 suppository, Rfl: 0   levothyroxine (SYNTHROID) 100 MCG tablet, Take 1 tablet (100 mcg total) by mouth daily before breakfast., Disp: 90 tablet, Rfl: 1   levothyroxine (SYNTHROID) 75 MCG tablet, Take 1 tablet (75 mcg total) by mouth daily., Disp: 90 tablet, Rfl: 1   ondansetron (ZOFRAN ODT) 4 MG disintegrating tablet, Take 1 tablet (4 mg total) by mouth every 8 (eight) hours as needed for nausea or vomiting., Disp: 20 tablet, Rfl: 0   ondansetron (ZOFRAN-ODT) 8 MG disintegrating tablet, Take 1 tablet (8 mg total) by mouth every 8 (eight) hours as needed for nausea or vomiting., Disp: 20 tablet, Rfl: 0   PARoxetine (PAXIL) 20 MG tablet, Take 1 tablet (20 mg total) by mouth daily., Disp: 90 tablet, Rfl: 3  Observations/Objective: Patient is well-developed, well-nourished in no acute distress.  Resting comfortably at home.  Head is normocephalic, atraumatic.  No labored breathing.  Speech is clear and coherent with logical content.  Patient is alert and oriented at baseline.    Assessment and Plan: 1. Persistent dry cough - benzonatate (TESSALON) 100 MG capsule; Take 1-2 capsules (100-200 mg total) by mouth 2 (two) times daily as needed for cough.  Dispense: 40 capsule; Refill: 0  2. Viral URI with cough - benzonatate (TESSALON) 100 MG capsule; Take 1-2 capsules (100-200 mg total) by mouth 2 (two) times daily as needed for cough.  Dispense: 40 capsule; Refill: 0  INSTRUCTIONS: use a humidifier for nasal congestion Drink plenty of fluids, rest and wash hands frequently to avoid the spread of infection Alternate tylenol and Motrin for relief of fever   Follow Up Instructions: I discussed the assessment and treatment plan with the patient. The patient was provided an opportunity to ask questions and all were answered. The patient  agreed with the plan and demonstrated an understanding of the instructions.  A copy of instructions were sent to the patient via MyChart unless otherwise noted below.    The patient was advised to call back or seek an in-person evaluation if the symptoms worsen or if the condition fails to improve as anticipated.  Time:  I spent 11 minutes with the patient via telehealth technology discussing the above problems/concerns.    Taylor Rigg, NP

## 2021-09-11 ENCOUNTER — Encounter: Payer: Self-pay | Admitting: Internal Medicine

## 2021-09-12 ENCOUNTER — Encounter: Payer: Self-pay | Admitting: Family Medicine

## 2021-09-12 ENCOUNTER — Ambulatory Visit (INDEPENDENT_AMBULATORY_CARE_PROVIDER_SITE_OTHER): Payer: Self-pay | Admitting: Family Medicine

## 2021-09-12 VITALS — BP 112/74 | HR 90 | Temp 98.1°F | Ht 64.0 in | Wt 104.9 lb

## 2021-09-12 DIAGNOSIS — J209 Acute bronchitis, unspecified: Secondary | ICD-10-CM

## 2021-09-12 MED ORDER — HYDROCODONE BIT-HOMATROP MBR 5-1.5 MG/5ML PO SOLN: 5.0000 mL | Freq: Three times a day (TID) | ORAL | 0 refills | Status: DC | PRN

## 2021-09-12 NOTE — Progress Notes (Signed)
Established Patient Office Visit  Subjective   Patient ID: Taylor Swanson, female    DOB: 1985-09-13  Age: 36 y.o. MRN: [id number]  Chief Complaint  Patient presents with   Cough    Patient complains of cough, x2 weeks, Non productive     HPI   9-day history of dry cough.  She states she had temperature around 99.3 initially but no fever recently.  No history of asthma or any chronic lung issues.  Non-smoker.  She did a virtual visit recently and prescribed Tessalon Perles which is not helping.  Significant nighttime cough.  She states that she tends to cough anytime she tries to speak.  She is requesting work note.  Out since Tuesday.  No postnasal drip symptoms.  No sore throat.  Minimal nasal congestion.  Past Medical History:  Diagnosis Date   Anxiety    Hypothyroid    Self mutilation    SELF MUTILATION 03/08/2007   Qualifier: History of  By: Fabian Sharp MD, Neta Mends    Past Surgical History:  Procedure Laterality Date   FRACTURE SURGERY     RIGHT ARM    reports that she has never smoked. She has never used smokeless tobacco. She reports current alcohol use. She reports that she does not use drugs. family history includes Acne in her father; Thyroid disease in her mother. Allergies  Allergen Reactions   Iron Other (See Comments)    CONSTIPATION    Review of Systems  Constitutional:  Negative for chills and fever.  HENT:  Negative for sore throat.   Respiratory:  Positive for cough. Negative for hemoptysis, sputum production, shortness of breath and wheezing.   Cardiovascular:  Negative for chest pain.      Objective:     BP 112/74 (BP Location: Left Arm, Patient Position: Sitting, Cuff Size: Normal)   Pulse 90   Temp 98.1 F (36.7 C) (Oral)   Ht 5\' 4"  (1.626 m)   Wt 104 lb 14.4 oz (47.6 kg)   LMP 08/29/2021   SpO2 99%   BMI 18.01 kg/m    Physical Exam Vitals reviewed.  Constitutional:      Appearance: Normal appearance.  HENT:     Right Ear: Tympanic  membrane normal.     Left Ear: Tympanic membrane normal.  Cardiovascular:     Rate and Rhythm: Normal rate and regular rhythm.  Pulmonary:     Effort: Pulmonary effort is normal.     Breath sounds: Normal breath sounds. No wheezing or rales.  Musculoskeletal:     Cervical back: Neck supple.  Lymphadenopathy:     Cervical: No cervical adenopathy.  Neurological:     Mental Status: She is alert.      No results found for any visits on 09/12/21.    The ASCVD Risk score (Arnett DK, et al., 2019) failed to calculate for the following reasons:   The 2019 ASCVD risk score is only valid for ages 81 to 23    Assessment & Plan:   Problem List Items Addressed This Visit   None Visit Diagnoses     Acute bronchitis, unspecified organism    -  Primary     Suspect acute viral bronchitis.  Patient nontoxic in appearance.  No respiratory distress.  No wheezes or rales noted.  Patient on Tessalon Perles without benefit.  We wrote for limited Hycodan cough syrup to take 1 teaspoon nightly for severe cough.  Work note written for last Tuesday through next  week on Tuesday.  Follow-up immediately for any fever or other concerns  No follow-ups on file.    Evelena Peat, MD

## 2021-09-21 ENCOUNTER — Encounter: Payer: Self-pay | Admitting: Family Medicine

## 2021-09-25 ENCOUNTER — Ambulatory Visit (HOSPITAL_COMMUNITY): Admit: 2021-09-25 | Payer: Self-pay

## 2021-09-26 ENCOUNTER — Ambulatory Visit (INDEPENDENT_AMBULATORY_CARE_PROVIDER_SITE_OTHER): Payer: Self-pay | Admitting: Family Medicine

## 2021-09-26 ENCOUNTER — Encounter: Payer: Self-pay | Admitting: Family Medicine

## 2021-09-26 DIAGNOSIS — J019 Acute sinusitis, unspecified: Secondary | ICD-10-CM

## 2021-09-26 DIAGNOSIS — B9689 Other specified bacterial agents as the cause of diseases classified elsewhere: Secondary | ICD-10-CM

## 2021-09-26 MED ORDER — LEVOFLOXACIN 750 MG PO TABS: 750.0000 mg | ORAL_TABLET | Freq: Every day | ORAL | 0 refills | Status: AC

## 2021-09-26 MED ORDER — PROMETHAZINE-DM 6.25-15 MG/5ML PO SYRP: 5.0000 mL | ORAL_SOLUTION | Freq: Four times a day (QID) | ORAL | 0 refills | Status: DC | PRN

## 2021-09-26 MED ORDER — PREDNISONE 20 MG PO TABS: 40.0000 mg | ORAL_TABLET | Freq: Every day | ORAL | 0 refills | Status: AC

## 2021-09-26 NOTE — Progress Notes (Signed)
   Established Patient Office Visit  Subjective   Patient ID: Taylor Swanson, female    DOB: [birth year]  Age: 36 y.o. MRN: [id number]  Chief Complaint  Patient presents with   Follow-up     Was seen on 9/8 for cough. Pt reports her sx are worsen. Cough along with sinus pressure, headache, runny nose. Patient added her sx going on 3 weeks.    Patient reports that the coughing has worsened/ not improved. States she is having fatigue, sore throat, sinus congestion, facial pressure and pain, getting hot when eating. Possibly subjective fever. States that her chest hurts when she is coughing,  no SOB or pain with inspiration. Positive dry heaving with cough. She reports that the cough is still not productive of sputum, just having severe drainage from her sinuses. States she wakes up in the middle of the night coughing also. States that she was worried that she might have pneumonia.       Review of Systems  All other systems reviewed and are negative.     Objective:     BP 106/70 (BP Location: Right Arm, Patient Position: Sitting, Cuff Size: Normal)   Pulse 91   Temp 98.6 F (37 C) (Oral)   Wt 103 lb (46.7 kg)   LMP 08/29/2021 (Exact Date)   SpO2 98%   BMI 17.68 kg/m    Physical Exam Vitals reviewed.  HENT:     Right Ear: Tympanic membrane and ear canal normal.     Left Ear: Tympanic membrane and ear canal normal.     Mouth/Throat:     Mouth: Mucous membranes are moist.     Pharynx: Oropharynx is clear. No oropharyngeal exudate or posterior oropharyngeal erythema.  Eyes:     Conjunctiva/sclera: Conjunctivae normal.  Cardiovascular:     Rate and Rhythm: Normal rate and regular rhythm.     Heart sounds: Normal heart sounds. No murmur heard. Pulmonary:     Effort: No respiratory distress.     Breath sounds: No stridor. Wheezing (mild wheezing with coughing) present. No rhonchi or rales.  Chest:     Chest wall: No tenderness.  Abdominal:     General: Bowel sounds are  normal.      No results found for any visits on 09/26/21.    The ASCVD Risk score (Arnett DK, et al., 2019) failed to calculate for the following reasons:   The 2019 ASCVD risk score is only valid for ages 56 to 87    Assessment & Plan:   Problem List Items Addressed This Visit       Respiratory   Acute bacterial sinusitis    Levaquin 750  mg daily for 7 days. Will give cough syrup. Promethazine DM 5 ml Q4  as needed for cough. I also heard a small amount of wheezing with forced expiration. I will treat with a 4 day burst of prednisone 40 mg daily to open airways and reduce inflammation.       Relevant Medications   levofloxacin (LEVAQUIN) 750 MG tablet   promethazine-dextromethorphan (PROMETHAZINE-DM) 6.25-15 MG/5ML syrup   predniSONE (DELTASONE) 20 MG tablet    No follow-ups on file.    Farrel Conners, MD

## 2021-09-26 NOTE — Assessment & Plan Note (Addendum)
Levaquin 750  mg daily for 7 days. Will give cough syrup. Promethazine DM 5 ml Q4  as needed for cough. I also heard a small amount of wheezing with forced expiration. I will treat with a 4 day burst of prednisone 40 mg daily to open airways and reduce inflammation.

## 2021-11-30 ENCOUNTER — Other Ambulatory Visit: Payer: Self-pay | Admitting: Internal Medicine

## 2021-11-30 DIAGNOSIS — F419 Anxiety disorder, unspecified: Secondary | ICD-10-CM

## 2022-01-20 ENCOUNTER — Other Ambulatory Visit: Payer: Self-pay | Admitting: Internal Medicine

## 2022-01-20 DIAGNOSIS — E039 Hypothyroidism, unspecified: Secondary | ICD-10-CM

## 2022-02-21 ENCOUNTER — Other Ambulatory Visit: Payer: Self-pay | Admitting: Internal Medicine

## 2022-02-21 DIAGNOSIS — F419 Anxiety disorder, unspecified: Secondary | ICD-10-CM

## 2022-03-18 ENCOUNTER — Other Ambulatory Visit: Payer: Self-pay | Admitting: Family

## 2022-03-28 ENCOUNTER — Other Ambulatory Visit: Payer: Self-pay | Admitting: Internal Medicine

## 2022-03-28 DIAGNOSIS — F419 Anxiety disorder, unspecified: Secondary | ICD-10-CM

## 2022-04-22 ENCOUNTER — Other Ambulatory Visit: Payer: Self-pay | Admitting: Family

## 2022-04-28 ENCOUNTER — Other Ambulatory Visit: Payer: Self-pay | Admitting: Family

## 2022-04-28 DIAGNOSIS — F419 Anxiety disorder, unspecified: Secondary | ICD-10-CM

## 2022-05-08 ENCOUNTER — Other Ambulatory Visit: Payer: Self-pay

## 2022-05-08 DIAGNOSIS — F419 Anxiety disorder, unspecified: Secondary | ICD-10-CM

## 2022-05-08 MED ORDER — PAROXETINE HCL 20 MG PO TABS: 20.0000 mg | ORAL_TABLET | Freq: Every day | ORAL | 0 refills | Status: DC

## 2022-05-08 MED ORDER — DROSPIRENONE-ETHINYL ESTRADIOL 3-0.03 MG PO TABS: 1.0000 | ORAL_TABLET | Freq: Every day | ORAL | 0 refills | Status: DC

## 2022-05-08 MED ORDER — LEVOTHYROXINE SODIUM 75 MCG PO TABS: 75.0000 ug | ORAL_TABLET | Freq: Every day | ORAL | 0 refills | Status: DC

## 2022-06-01 ENCOUNTER — Other Ambulatory Visit: Payer: Self-pay | Admitting: Internal Medicine

## 2022-07-09 ENCOUNTER — Other Ambulatory Visit: Payer: Self-pay | Admitting: Family

## 2022-07-13 ENCOUNTER — Other Ambulatory Visit: Payer: Self-pay

## 2022-07-13 MED ORDER — DROSPIRENONE-ETHINYL ESTRADIOL 3-0.03 MG PO TABS: 1.0000 | ORAL_TABLET | Freq: Every day | ORAL | 0 refills | Status: DC

## 2022-07-29 ENCOUNTER — Other Ambulatory Visit: Payer: Self-pay | Admitting: Internal Medicine

## 2022-07-29 DIAGNOSIS — F419 Anxiety disorder, unspecified: Secondary | ICD-10-CM

## 2022-07-29 NOTE — Telephone Encounter (Signed)
Patient is due for lab recheck.   Attempted to reach pt. Left a voicemail to call us back

## 2022-08-03 ENCOUNTER — Other Ambulatory Visit: Payer: Self-pay

## 2022-08-03 NOTE — Progress Notes (Unsigned)
No chief complaint on file.   HPI: Patient  Taylor Swanson  37 y.o. comes in today for Preventive Health Care visit   Health Maintenance  Topic Date Due   COVID-19 Vaccine (1) Never done   Hepatitis C Screening  Never done   PAP SMEAR-Modifier  01/08/2022   INFLUENZA VACCINE  08/06/2022   DTaP/Tdap/Td (2 - Td or Tdap) 10/15/2025   HIV Screening  Completed   HPV VACCINES  Aged Out   Health Maintenance Review LIFESTYLE:  Exercise:   Tobacco/ETS: Alcohol:  Sugar beverages: Sleep: Drug use: no HH of  Work:    ROS:  GEN/ HEENT: No fever, significant weight changes sweats headaches vision problems hearing changes, CV/ PULM; No chest pain shortness of breath cough, syncope,edema  change in exercise tolerance. GI /GU: No adominal pain, vomiting, change in bowel habits. No blood in the stool. No significant GU symptoms. SKIN/HEME: ,no acute skin rashes suspicious lesions or bleeding. No lymphadenopathy, nodules, masses.  NEURO/ PSYCH:  No neurologic signs such as weakness numbness. No depression anxiety. IMM/ Allergy: No unusual infections.  Allergy .   REST of 12 system review negative except as per HPI   Past Medical History:  Diagnosis Date   Anxiety    Hypothyroid    Self mutilation    SELF MUTILATION 03/08/2007   Qualifier: History of  By: Fabian Sharp MD, Neta Mends     Past Surgical History:  Procedure Laterality Date   FRACTURE SURGERY     RIGHT ARM    Family History  Problem Relation Age of Onset   Thyroid disease Mother    Acne Father     Social History   Socioeconomic History   Marital status: Single    Spouse name: Not on file   Number of children: Not on file   Years of education: Not on file   Highest education level: 11th grade  Occupational History   Not on file  Tobacco Use   Smoking status: Never   Smokeless tobacco: Never  Vaping Use   Vaping status: Never Used  Substance and Sexual Activity   Alcohol use: Yes    Comment: occ   Drug  use: No   Sexual activity: Never    Birth control/protection: Pill  Other Topics Concern   Not on file  Social History Narrative   Single   Regular exercise- yes sometimes    ? Tad at home finishing hs on line [employer] to get hs diploma         Lives in her household with 9 cats and dogs likes animals takes care of them.    trying to get her driver's license.      Neg tad    Social Determinants of Health   Financial Resource Strain: High Risk (02/10/2021)   Overall Financial Resource Strain (CARDIA)    Difficulty of Paying Living Expenses: Very hard  Food Insecurity: Food Insecurity Present (02/10/2021)   Hunger Vital Sign    Worried About Running Out of Food in the Last Year: Often true    Ran Out of Food in the Last Year: Sometimes true  Transportation Needs: Unmet Transportation Needs (02/10/2021)   PRAPARE - Administrator, Civil Service (Medical): Yes    Lack of Transportation (Non-Medical): Yes  Physical Activity: Insufficiently Active (02/10/2021)   Exercise Vital Sign    Days of Exercise per Week: 3 days    Minutes of Exercise per Session: 20 min  Stress: Stress Concern Present (02/10/2021)   [employer] of Occupational Health - Occupational Stress Questionnaire    Feeling of Stress : Very much  Social Connections: Moderately Isolated (02/10/2021)   Social Connection and Isolation Panel [NHANES]    Frequency of Communication with Friends and Family: Once a week    Frequency of Social Gatherings with Friends and Family: Once a week    Attends Religious Services: More than 4 times per year    Active Member of [employer] or Organizations: Yes    Attends Banker Meetings: 1 to 4 times per year    Marital Status: Never married    Outpatient Medications Prior to Visit  Medication Sig Dispense Refill   benzonatate (TESSALON) 100 MG capsule Take 1-2 capsules (100-200 mg total) by mouth 2 (two) times daily as needed for cough. (Patient not  taking: Reported on 09/26/2021) 40 capsule 0   drospirenone-ethinyl estradiol (YASMIN) 3-0.03 MG tablet TAKE 1 TABLET BY MOUTH ONCE DAILY . APPOINTMENT REQUIRED FOR FUTURE REFILLS 28 tablet 0   HYDROcodone bit-homatropine (HYCODAN) 5-1.5 MG/5ML syrup Take 5 mLs by mouth every 8 (eight) hours as needed for cough. (Patient not taking: Reported on 09/26/2021) 120 mL 0   levothyroxine (SYNTHROID) 100 MCG tablet TAKE 1 TABLET BY MOUTH ONCE DAILY BEFORE BREAKFAST 90 tablet 1   levothyroxine (SYNTHROID) 75 MCG tablet Take 1 tablet by mouth once daily 90 tablet 0   ondansetron (ZOFRAN-ODT) 8 MG disintegrating tablet Take 1 tablet (8 mg total) by mouth every 8 (eight) hours as needed for nausea or vomiting. 20 tablet 0   PARoxetine (PAXIL) 20 MG tablet Take 1 tablet by mouth once daily 90 tablet 0   promethazine-dextromethorphan (PROMETHAZINE-DM) 6.25-15 MG/5ML syrup Take 5 mLs by mouth 4 (four) times daily as needed for cough. 240 mL 0   No facility-administered medications prior to visit.     EXAM:  There were no vitals taken for this visit.  There is no height or weight on file to calculate BMI. Wt Readings from Last 3 Encounters:  09/26/21 103 lb (46.7 kg)  09/12/21 104 lb 14.4 oz (47.6 kg)  07/21/21 105 lb 3.2 oz (47.7 kg)    Physical Exam: Vital signs reviewed ZOX:WRUE is a well-developed well-nourished alert cooperative    who appearsr stated age in no acute distress.  HEENT: normocephalic atraumatic , Eyes: PERRL EOM's full, conjunctiva clear, Nares: paten,t no deformity discharge or tenderness., Ears: no deformity EAC's clear TMs with normal landmarks. Mouth: clear OP, no lesions, edema.  Moist mucous membranes. Dentition in adequate repair. NECK: supple without masses, thyromegaly or bruits. CHEST/PULM:  Clear to auscultation and percussion breath sounds equal no wheeze , rales or rhonchi. No chest wall deformities or tenderness. Breast: normal by inspection . No dimpling, discharge,  masses, tenderness or discharge . CV: PMI is nondisplaced, S1 S2 no gallops, murmurs, rubs. Peripheral pulses are full without delay.No JVD .  ABDOMEN: Bowel sounds normal nontender  No guard or rebound, no hepato splenomegal no CVA tenderness.  No hernia. Extremtities:  No clubbing cyanosis or edema, no acute joint swelling or redness no focal atrophy NEURO:  Oriented x3, cranial nerves 3-12 appear to be intact, no obvious focal weakness,gait within normal limits no abnormal reflexes or asymmetrical SKIN: No acute rashes normal turgor, color, no bruising or petechiae. PSYCH: Oriented, good eye contact, no obvious depression anxiety, cognition and judgment appear normal. LN: no cervical axillary adenopathy  Lab Results  Component Value Date  WBC 8.0 03/22/2020   HGB 13.1 03/22/2020   HCT 40.8 03/22/2020   PLT 318 03/22/2020   GLUCOSE 90 03/22/2020   CHOL 182 02/02/2020   TRIG 132.0 02/02/2020   HDL 46.30 02/02/2020   LDLCALC 109 (H) 02/02/2020   ALT 19 03/22/2020   AST 22 03/22/2020   NA 137 03/22/2020   K 4.2 03/22/2020   CL 105 03/22/2020   CREATININE 0.84 03/22/2020   BUN 11 03/22/2020   CO2 21 (L) 03/22/2020   TSH 12.14 (H) 07/21/2021   HGBA1C 5.3 02/11/2021    BP Readings from Last 3 Encounters:  09/26/21 106/70  09/12/21 112/74  07/21/21 120/80    Lab results reviewed with patient   ASSESSMENT AND PLAN:  Discussed the following assessment and plan:    ICD-10-CM   1. Visit for preventive health examination  Z00.00     2. Hypothyroidism, unspecified type  E03.9     3. Medication management  Z79.899      No follow-ups on file.  Patient Care Team: Merril Isakson, Neta Mends, MD as PCP - Freddi Che, DO as Attending Physician (Obstetrics and Gynecology) There are no Patient Instructions on file for this visit.  Neta Mends. Saurabh Hettich M.D.

## 2022-08-04 ENCOUNTER — Encounter: Payer: Self-pay | Admitting: Internal Medicine

## 2022-08-04 ENCOUNTER — Ambulatory Visit: Payer: 59 | Admitting: Internal Medicine

## 2022-08-04 VITALS — BP 130/80 | HR 84 | Temp 98.3°F | Ht 64.7 in | Wt 112.0 lb

## 2022-08-04 DIAGNOSIS — Z0000 Encounter for general adult medical examination without abnormal findings: Secondary | ICD-10-CM | POA: Diagnosis not present

## 2022-08-04 DIAGNOSIS — Z3041 Encounter for surveillance of contraceptive pills: Secondary | ICD-10-CM | POA: Diagnosis not present

## 2022-08-04 DIAGNOSIS — Z1159 Encounter for screening for other viral diseases: Secondary | ICD-10-CM

## 2022-08-04 DIAGNOSIS — Z79899 Other long term (current) drug therapy: Secondary | ICD-10-CM | POA: Diagnosis not present

## 2022-08-04 DIAGNOSIS — Z803 Family history of malignant neoplasm of breast: Secondary | ICD-10-CM

## 2022-08-04 DIAGNOSIS — E039 Hypothyroidism, unspecified: Secondary | ICD-10-CM | POA: Diagnosis not present

## 2022-08-04 DIAGNOSIS — R739 Hyperglycemia, unspecified: Secondary | ICD-10-CM | POA: Diagnosis not present

## 2022-08-04 MED ORDER — DROSPIRENONE-ETHINYL ESTRADIOL 3-0.03 MG PO TABS: 1.0000 | ORAL_TABLET | Freq: Every day | ORAL | 3 refills | Status: DC

## 2022-08-04 NOTE — Patient Instructions (Addendum)
Good to see you today I dont think the OCP caused weight gain  limit salt before period . But thyroid off could effect .   Avoid limit  simple sugars  and sweets   french fries etc.  Add protein for breakfast . Limit  processed cereals.  Fu if bloating is getting worse  without period relief .   Due for pap smear next year  .Marland Kitchen

## 2022-08-05 NOTE — Progress Notes (Signed)
So  thyroid is now in rang  on the 75 mcg  (not sure why iw was off in January)   Ok to stay on 75 mcg per day   (staff please update the med list and refill the disp # 90 days x 3  if due and take off the 100 mcg  dose)  Rest of labs ok  choelsterol could be better .  No diabetes  Work on optimizing eating diet  to see if helps the weight  concerns  plan yearly check or earlier if needed

## 2022-08-08 ENCOUNTER — Other Ambulatory Visit: Payer: Self-pay | Admitting: Internal Medicine

## 2022-08-08 DIAGNOSIS — F419 Anxiety disorder, unspecified: Secondary | ICD-10-CM

## 2022-08-10 ENCOUNTER — Other Ambulatory Visit (HOSPITAL_COMMUNITY): Payer: Self-pay

## 2022-08-10 ENCOUNTER — Other Ambulatory Visit: Payer: Self-pay

## 2022-08-10 MED ORDER — LEVOTHYROXINE SODIUM 75 MCG PO TABS
75.0000 ug | ORAL_TABLET | Freq: Every day | ORAL | 0 refills | Status: DC
  Filled 2022-08-10: qty 90, 90d supply, fill #0

## 2022-08-10 MED ORDER — PAROXETINE HCL 20 MG PO TABS
20.0000 mg | ORAL_TABLET | Freq: Every day | ORAL | 0 refills | Status: DC
Start: 2022-08-10 — End: 2023-01-14
  Filled 2022-08-10: qty 90, 90d supply, fill #0

## 2022-08-11 ENCOUNTER — Encounter: Payer: Self-pay | Admitting: Pharmacist

## 2022-08-11 ENCOUNTER — Other Ambulatory Visit: Payer: Self-pay

## 2022-08-14 ENCOUNTER — Other Ambulatory Visit: Payer: Self-pay

## 2023-01-10 ENCOUNTER — Other Ambulatory Visit: Payer: Self-pay | Admitting: Family

## 2023-01-13 ENCOUNTER — Telehealth: Payer: Self-pay

## 2023-01-13 DIAGNOSIS — F419 Anxiety disorder, unspecified: Secondary | ICD-10-CM

## 2023-01-13 NOTE — Telephone Encounter (Signed)
 Copied from CRM [id number]. Topic: Clinical - Medication Refill >> Jan 13, 2023  2:35 PM Merlynn LABOR wrote: Most Recent Primary Care Visit:  Provider: CHARLETT HOWARD K  Department: [facility]  Visit Type: PHYSICAL  Date: 08/04/2022  Medication: PARoxetine  (PAXIL ) 20 MG tablet   Has the patient contacted their pharmacy? Yes (Agent: If no, request that the patient contact the pharmacy for the refill. If patient does not wish to contact the pharmacy document the reason why and proceed with request.) (Agent: If yes, when and what did the pharmacy advise?)  Patient contacted pharmacy and was advised to contact provider for refill.   Is this the correct pharmacy for this prescription? Yes If no, delete pharmacy and type the correct one.  This is the patient's preferred pharmacy:  [facility]  [address] [place], [place] [address]   Has the prescription been filled recently? No  Is the patient out of the medication? Yes  Has the patient been seen for an appointment in the last year OR does the patient have an upcoming appointment? Yes  Can we respond through MyChart? Yes  Agent: Please be advised that Rx refills may take up to 3 business days. We ask that you follow-up with your pharmacy.

## 2023-01-14 MED ORDER — PAROXETINE HCL 20 MG PO TABS
20.0000 mg | ORAL_TABLET | Freq: Every day | ORAL | 0 refills | Status: DC
Start: 2023-01-14 — End: 2023-04-04

## 2023-01-14 NOTE — Addendum Note (Signed)
 Addended byVickii Chafe on: 01/14/2023 03:21 PM   Modules accepted: Orders

## 2023-01-14 NOTE — Telephone Encounter (Signed)
 Attempted to reach pt. Left a detail message that Rx was sent.

## 2023-01-18 ENCOUNTER — Other Ambulatory Visit: Payer: Self-pay

## 2023-01-18 MED ORDER — LEVOTHYROXINE SODIUM 75 MCG PO TABS: 75.0000 ug | ORAL_TABLET | Freq: Every day | ORAL | 1 refills | Status: DC

## 2023-04-04 ENCOUNTER — Other Ambulatory Visit: Payer: Self-pay | Admitting: Internal Medicine

## 2023-04-04 DIAGNOSIS — F419 Anxiety disorder, unspecified: Secondary | ICD-10-CM

## 2023-06-22 ENCOUNTER — Other Ambulatory Visit: Payer: Self-pay | Admitting: Internal Medicine

## 2023-07-04 ENCOUNTER — Other Ambulatory Visit: Payer: Self-pay | Admitting: Family

## 2023-07-04 DIAGNOSIS — F419 Anxiety disorder, unspecified: Secondary | ICD-10-CM

## 2023-07-15 ENCOUNTER — Other Ambulatory Visit: Payer: Self-pay | Admitting: Family

## 2023-07-15 ENCOUNTER — Other Ambulatory Visit: Payer: Self-pay | Admitting: Internal Medicine

## 2023-07-15 DIAGNOSIS — F419 Anxiety disorder, unspecified: Secondary | ICD-10-CM

## 2023-09-13 ENCOUNTER — Other Ambulatory Visit: Payer: Self-pay | Admitting: Family

## 2023-10-07 ENCOUNTER — Telehealth: Payer: Self-pay

## 2023-10-07 NOTE — Telephone Encounter (Signed)
 Pt was last seen 08/04/2022.   Attempted to reach pt regarding to Rx refill and visit. Left a voicemail to call us  back.

## 2023-10-18 ENCOUNTER — Other Ambulatory Visit (HOSPITAL_BASED_OUTPATIENT_CLINIC_OR_DEPARTMENT_OTHER): Payer: Self-pay

## 2024-01-12 ENCOUNTER — Other Ambulatory Visit: Payer: Self-pay | Admitting: Internal Medicine

## 2024-01-12 DIAGNOSIS — F419 Anxiety disorder, unspecified: Secondary | ICD-10-CM

## 2024-01-12 NOTE — Telephone Encounter (Signed)
 Refill for 30 days until can get appt

## 2024-01-18 ENCOUNTER — Other Ambulatory Visit: Payer: Self-pay | Admitting: Internal Medicine

## 2024-01-18 ENCOUNTER — Telehealth: Payer: Self-pay | Admitting: Internal Medicine

## 2024-01-18 VITALS — Ht 64.7 in | Wt 112.0 lb

## 2024-01-18 DIAGNOSIS — E039 Hypothyroidism, unspecified: Secondary | ICD-10-CM

## 2024-01-18 DIAGNOSIS — Z3041 Encounter for surveillance of contraceptive pills: Secondary | ICD-10-CM

## 2024-01-18 DIAGNOSIS — R739 Hyperglycemia, unspecified: Secondary | ICD-10-CM

## 2024-01-18 DIAGNOSIS — Z79899 Other long term (current) drug therapy: Secondary | ICD-10-CM

## 2024-01-18 DIAGNOSIS — F419 Anxiety disorder, unspecified: Secondary | ICD-10-CM

## 2024-01-18 MED ORDER — PAROXETINE HCL 20 MG PO TABS: 20.0000 mg | ORAL_TABLET | Freq: Every day | ORAL | 1 refills | Status: AC

## 2024-01-18 MED ORDER — LEVOTHYROXINE SODIUM 75 MCG PO TABS: 75.0000 ug | ORAL_TABLET | Freq: Every day | ORAL | 1 refills | Status: AC

## 2024-01-18 NOTE — Progress Notes (Signed)
 Future labs  to be done in the next few months  If lab closer to residence in [place] we can send order to local lab as possible

## 2024-01-18 NOTE — Progress Notes (Signed)
 " Virtual Visit via Video Note  I connected with Taylor Swanson on 01/18/2024 at  4:00 PM EST by a video enabled telemedicine application and verified that I am speaking with the correct person using two identifiers. Location patient: home Location provider:work office Persons participating in the virtual visit: patient, provider   Patient aware  of the limitations of evaluation and management by telemedicine and  availability of in person appointments. and agreed to proceed.   HPI: Taylor Swanson presents for video visit for med hceck   Moved back to [place] after gp death   and now will be living there. Paxil : still helpful needs refill Thyroid  med was off for a while and now out for about a month : OCP local provder gave a refill  so still has some  helpful periods short still some cramps but otherwise ok  Denies other change in medical status. Negtad  looking for local job now  doesn drive  ROS: See pertinent positives and negatives per HPI.  Past Medical History:  Diagnosis Date   Anxiety    Hypothyroid    Self mutilation    SELF MUTILATION 03/08/2007   Qualifier: History of  By: Charlett MD, Apolinar POUR     Past Surgical History:  Procedure Laterality Date   FRACTURE SURGERY     RIGHT ARM    Family History  Problem Relation Age of Onset   Thyroid  disease Mother    Acne Father     Social History[1]   Current Medications[2]  EXAM: BP Readings from Last 3 Encounters:  08/04/22 130/80  09/26/21 106/70  09/12/21 112/74   Connection  slow but completed visit VITALS per patient if applicable:  GENERAL: alert, oriented, appears well and in no acute distress  HEENT: atraumatic, conjunttiva clear, no obvious abnormalities on inspection of external nose and ears  NECK: normal movements of the head and neck  LUNGS: on inspection no signs of respiratory distress, breathing rate appears normal, no obvious gross SOB, gasping or wheezing  CV: no obvious  cyanosis  MS: moves all visible extremities without noticeable abnormality  PSYCH/NEURO: pleasant and cooperative, no obvious depression or anxiety, speech and thought processing grossly intact Lab Results  Component Value Date   WBC 7.3 08/04/2022   HGB 12.8 08/04/2022   HCT 40.0 08/04/2022   PLT 309.0 08/04/2022   GLUCOSE 114 (H) 08/04/2022   CHOL 202 (H) 08/04/2022   TRIG 113.0 08/04/2022   HDL 53.60 08/04/2022   LDLCALC 125 (H) 08/04/2022   ALT 17 08/04/2022   AST 22 08/04/2022   NA 138 08/04/2022   K 3.8 08/04/2022   CL 102 08/04/2022   CREATININE 0.68 08/04/2022   BUN 11 08/04/2022   CO2 27 08/04/2022   TSH 1.25 08/04/2022   HGBA1C 5.5 08/04/2022    ASSESSMENT AND PLAN:  Discussed the following assessment and plan:    ICD-10-CM   1. Medication management  Z79.899     2. Anxiety disorder, unspecified type  F41.9 PARoxetine  (PAXIL ) 20 MG tablet   continue paxil     3. Hypothyroidism, unspecified type  E03.9     4. Oral contraceptive pill surveillance  Z30.41     5. Elevated blood sugar  R73.9     Medication refills discussed   Controlled  conditions by hx  so  continue Updated lab advised  Thyroid  levels need to be monitored but has been out of r month .   indicated  and plan fo  updated lb monitoring  and continue Refills sent 6 mos worth .  Plan labs  in system to include bmp tsh cbc A1c  and since she doesn't drive   she can let us  know if  order to a local lab would be helpful.  As now she will be living in [facility].   Expectant management and discussion of plan and treatment with opportunity to ask questions and all were answered. The patient agreed with the plan and demonstrated an understanding of the instructions.   Advised to call back or seek an in-person evaluation if worsening  or having  further concerns  in interim. Return for lab tests and    6 mos fu  asindicated .    Apolinar Eastern, MD      [1]  Social History Tobacco  Use   Smoking status: Never   Smokeless tobacco: Never  Vaping Use   Vaping status: Never Used  Substance Use Topics   Alcohol use: Yes    Comment: occ   Drug use: No  [2]  Current Outpatient Medications:    drospirenone -ethinyl estradiol  (YASMIN ) 3-0.03 MG tablet, Take 1 tablet by mouth once daily, Disp: 84 tablet, Rfl: 0   levothyroxine  (SYNTHROID ) 75 MCG tablet, Take 1 tablet (75 mcg total) by mouth daily., Disp: 90 tablet, Rfl: 1   ondansetron  (ZOFRAN -ODT) 8 MG disintegrating tablet, Take 1 tablet (8 mg total) by mouth every 8 (eight) hours as needed for nausea or vomiting. (Patient not taking: Reported on 08/04/2022), Disp: 20 tablet, Rfl: 0   PARoxetine  (PAXIL ) 20 MG tablet, Take 1 tablet (20 mg total) by mouth daily., Disp: 90 tablet, Rfl: 1  "

## 2024-01-20 NOTE — Telephone Encounter (Signed)
 See VV sent to [facility]  pharmacy
